# Patient Record
Sex: Male | Born: 1945 | Marital: Married | Smoking: Former smoker
Health system: Southern US, Community
[De-identification: ages and names within clinical notes are randomized; demographics above are authoritative.]

## PROBLEM LIST (undated history)

## (undated) DIAGNOSIS — M1990 Unspecified osteoarthritis, unspecified site: Secondary | ICD-10-CM

## (undated) DIAGNOSIS — T7840XA Allergy, unspecified, initial encounter: Secondary | ICD-10-CM

## (undated) DIAGNOSIS — S7291XA Unspecified fracture of right femur, initial encounter for closed fracture: Secondary | ICD-10-CM

## (undated) DIAGNOSIS — H3530 Unspecified macular degeneration: Secondary | ICD-10-CM

## (undated) DIAGNOSIS — M419 Scoliosis, unspecified: Secondary | ICD-10-CM

## (undated) DIAGNOSIS — H409 Unspecified glaucoma: Secondary | ICD-10-CM

## (undated) DIAGNOSIS — N183 Chronic kidney disease, stage 3 unspecified: Secondary | ICD-10-CM

## (undated) DIAGNOSIS — S82892A Other fracture of left lower leg, initial encounter for closed fracture: Secondary | ICD-10-CM

## (undated) DIAGNOSIS — I10 Essential (primary) hypertension: Secondary | ICD-10-CM

## (undated) DIAGNOSIS — H269 Unspecified cataract: Secondary | ICD-10-CM

## (undated) HISTORY — DX: Unspecified macular degeneration: H35.30

## (undated) HISTORY — PX: COLONOSCOPY: SHX174

## (undated) HISTORY — DX: Unspecified fracture of right femur, initial encounter for closed fracture: S72.91XA

## (undated) HISTORY — DX: Allergy, unspecified, initial encounter: T78.40XA

## (undated) HISTORY — DX: Unspecified cataract: H26.9

## (undated) HISTORY — DX: Chronic kidney disease, stage 3 unspecified: N18.30

## (undated) HISTORY — DX: Unspecified osteoarthritis, unspecified site: M19.90

## (undated) HISTORY — DX: Essential (primary) hypertension: I10

## (undated) HISTORY — PX: DENTAL SURGERY: SHX609

## (undated) HISTORY — DX: Other fracture of left lower leg, initial encounter for closed fracture: S82.892A

## (undated) HISTORY — DX: Chronic kidney disease, stage 3 (moderate): N18.3

## (undated) HISTORY — DX: Scoliosis, unspecified: M41.9

## (undated) HISTORY — DX: Unspecified glaucoma: H40.9

---
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
## 2009-10-04 DIAGNOSIS — R351 Nocturia: Secondary | ICD-10-CM | POA: Insufficient documentation

## 2009-10-04 DIAGNOSIS — M545 Low back pain, unspecified: Secondary | ICD-10-CM | POA: Insufficient documentation

## 2009-10-04 DIAGNOSIS — M419 Scoliosis, unspecified: Secondary | ICD-10-CM | POA: Insufficient documentation

## 2011-10-17 DIAGNOSIS — I10 Essential (primary) hypertension: Secondary | ICD-10-CM | POA: Diagnosis not present

## 2011-10-17 DIAGNOSIS — Z125 Encounter for screening for malignant neoplasm of prostate: Secondary | ICD-10-CM | POA: Diagnosis not present

## 2011-10-24 DIAGNOSIS — Z125 Encounter for screening for malignant neoplasm of prostate: Secondary | ICD-10-CM | POA: Diagnosis not present

## 2011-10-24 DIAGNOSIS — I10 Essential (primary) hypertension: Secondary | ICD-10-CM | POA: Diagnosis not present

## 2011-10-24 DIAGNOSIS — R351 Nocturia: Secondary | ICD-10-CM | POA: Diagnosis not present

## 2011-10-24 DIAGNOSIS — M545 Low back pain: Secondary | ICD-10-CM | POA: Diagnosis not present

## 2011-10-24 DIAGNOSIS — Z0000 Encounter for general adult medical examination without abnormal findings: Secondary | ICD-10-CM | POA: Diagnosis not present

## 2011-10-24 DIAGNOSIS — Z1212 Encounter for screening for malignant neoplasm of rectum: Secondary | ICD-10-CM | POA: Diagnosis not present

## 2012-07-06 DIAGNOSIS — Z23 Encounter for immunization: Secondary | ICD-10-CM | POA: Diagnosis not present

## 2012-07-12 DIAGNOSIS — H3530 Unspecified macular degeneration: Secondary | ICD-10-CM | POA: Diagnosis not present

## 2012-07-12 DIAGNOSIS — H43819 Vitreous degeneration, unspecified eye: Secondary | ICD-10-CM | POA: Diagnosis not present

## 2012-07-12 DIAGNOSIS — H2510 Age-related nuclear cataract, unspecified eye: Secondary | ICD-10-CM | POA: Diagnosis not present

## 2012-07-12 DIAGNOSIS — H52209 Unspecified astigmatism, unspecified eye: Secondary | ICD-10-CM | POA: Diagnosis not present

## 2012-07-13 DIAGNOSIS — H43819 Vitreous degeneration, unspecified eye: Secondary | ICD-10-CM | POA: Diagnosis not present

## 2012-08-19 DIAGNOSIS — H43819 Vitreous degeneration, unspecified eye: Secondary | ICD-10-CM | POA: Diagnosis not present

## 2012-10-21 DIAGNOSIS — I10 Essential (primary) hypertension: Secondary | ICD-10-CM | POA: Diagnosis not present

## 2012-10-21 DIAGNOSIS — Z125 Encounter for screening for malignant neoplasm of prostate: Secondary | ICD-10-CM | POA: Diagnosis not present

## 2012-10-28 ENCOUNTER — Encounter: Payer: Self-pay | Admitting: Internal Medicine

## 2012-10-28 DIAGNOSIS — N318 Other neuromuscular dysfunction of bladder: Secondary | ICD-10-CM | POA: Diagnosis not present

## 2012-10-28 DIAGNOSIS — I10 Essential (primary) hypertension: Secondary | ICD-10-CM | POA: Diagnosis not present

## 2012-10-28 DIAGNOSIS — Z125 Encounter for screening for malignant neoplasm of prostate: Secondary | ICD-10-CM | POA: Diagnosis not present

## 2012-10-28 DIAGNOSIS — Z0000 Encounter for general adult medical examination without abnormal findings: Secondary | ICD-10-CM | POA: Diagnosis not present

## 2012-10-28 DIAGNOSIS — Z1331 Encounter for screening for depression: Secondary | ICD-10-CM | POA: Diagnosis not present

## 2012-10-28 DIAGNOSIS — N182 Chronic kidney disease, stage 2 (mild): Secondary | ICD-10-CM | POA: Diagnosis not present

## 2012-11-01 DIAGNOSIS — H3530 Unspecified macular degeneration: Secondary | ICD-10-CM | POA: Diagnosis not present

## 2012-11-01 DIAGNOSIS — H2510 Age-related nuclear cataract, unspecified eye: Secondary | ICD-10-CM | POA: Diagnosis not present

## 2012-11-01 DIAGNOSIS — H43819 Vitreous degeneration, unspecified eye: Secondary | ICD-10-CM | POA: Diagnosis not present

## 2012-11-01 DIAGNOSIS — Z1212 Encounter for screening for malignant neoplasm of rectum: Secondary | ICD-10-CM | POA: Diagnosis not present

## 2012-11-01 DIAGNOSIS — H5210 Myopia, unspecified eye: Secondary | ICD-10-CM | POA: Diagnosis not present

## 2012-11-24 ENCOUNTER — Ambulatory Visit (AMBULATORY_SURGERY_CENTER): Payer: Medicare Other | Admitting: *Deleted

## 2012-11-24 VITALS — Ht 75.5 in | Wt 221.4 lb

## 2012-11-24 DIAGNOSIS — Z1211 Encounter for screening for malignant neoplasm of colon: Secondary | ICD-10-CM

## 2012-11-24 MED ORDER — MOVIPREP 100 G PO SOLR: 1.0000 | Freq: Once | ORAL | Status: DC

## 2012-11-24 NOTE — Progress Notes (Signed)
No egg or soy allergy. ewm  No problems with sedation in the past. ewm

## 2012-12-01 ENCOUNTER — Telehealth: Payer: Self-pay | Admitting: Internal Medicine

## 2012-12-01 NOTE — Telephone Encounter (Signed)
Did speak with patient. He states he's feeling okay to have exam, denies any fever. Instructed patient to use his on judgement, that it seems he is okay to have procedure unless symptoms worsen or he starts having fever. He understands.

## 2012-12-06 ENCOUNTER — Ambulatory Visit (AMBULATORY_SURGERY_CENTER): Payer: Medicare Other | Admitting: Internal Medicine

## 2012-12-06 ENCOUNTER — Encounter: Payer: Self-pay | Admitting: Internal Medicine

## 2012-12-06 VITALS — BP 112/79 | HR 70 | Temp 98.5°F | Resp 23 | Ht 75.5 in | Wt 221.0 lb

## 2012-12-06 DIAGNOSIS — K5730 Diverticulosis of large intestine without perforation or abscess without bleeding: Secondary | ICD-10-CM

## 2012-12-06 DIAGNOSIS — Z1211 Encounter for screening for malignant neoplasm of colon: Secondary | ICD-10-CM

## 2012-12-06 DIAGNOSIS — I10 Essential (primary) hypertension: Secondary | ICD-10-CM | POA: Diagnosis not present

## 2012-12-06 MED ORDER — SODIUM CHLORIDE 0.9 % IV SOLN: 500.0000 mL | INTRAVENOUS | Status: DC

## 2012-12-06 MED ORDER — FLEET ENEMA 7-19 GM/118ML RE ENEM: 1.0000 | ENEMA | Freq: Once | RECTAL | Status: DC

## 2012-12-06 NOTE — Progress Notes (Signed)
Stool changed after enema from cloudy light brown to clear yellow.

## 2012-12-06 NOTE — Progress Notes (Signed)
Patient did not experience any of the following events: a burn prior to discharge; a fall within the facility; wrong site/side/patient/procedure/implant event; or a hospital transfer or hospital admission upon discharge from the facility. (G8907) Patient did not have preoperative order for IV antibiotic SSI prophylaxis. (G8918)  

## 2012-12-06 NOTE — Op Note (Signed)
 [facility] [address] [place], [address]   COLONOSCOPY PROCEDURE REPORT  PATIENT: Ricky Singh, Ricky Singh  MR#: [id number] BIRTHDATE: November 22, 1945 , 66  yrs. old GENDER: Male ENDOSCOPIST: Roxy Cedar, MD REFERRED XB:JYNWGN Eloise Harman, M.D. PROCEDURE DATE:  12/06/2012 PROCEDURE:   Colonoscopy, screening ASA CLASS:   Class II INDICATIONS:Average risk patient for colon cancer.   Reports initial colonoscopy in [place] 10+ yrs ago (no polyps) MEDICATIONS: MAC sedation, administered by CRNA and propofol (Diprivan) 600mg  IV  DESCRIPTION OF PROCEDURE:   After the risks benefits and alternatives of the procedure were thoroughly explained, informed consent was obtained.  A digital rectal exam revealed no abnormalities of the rectum.   The LB CF-[id number]  endoscope was introduced through the anus and advanced to the cecum, which was identified by both the appendix and ileocecal valve. No adverse events experienced.   The quality of the prep was adequate, using MoviPrep  The instrument was then slowly withdrawn as the colon was fully examined.      COLON FINDINGS: Moderate diverticulosis was noted in the sigmoid colon.   The colon was otherwise normal.  There was no inflammation, polyps or cancers .  Retroflexed views revealed internal hemorrhoids. The time to cecum=4 minutes 23 seconds. Withdrawal time=14 minutes 10 seconds.  The scope was withdrawn and the procedure completed. COMPLICATIONS: There were no complications.  ENDOSCOPIC IMPRESSION: 1.   Moderate diverticulosis was noted in the sigmoid colon 2.   The colon was otherwise normal  RECOMMENDATIONS: 1. Continue current colorectal screening recommendations for "routine risk" patients with a repeat colonoscopy in 10 years.   eSigned:  Roxy Cedar, MD 12/06/2012 2:40 PM   cc: Jarome Matin, MD and The Patient

## 2012-12-06 NOTE — Patient Instructions (Addendum)
YOU HAD AN ENDOSCOPIC PROCEDURE TODAY AT THE [facility]: Refer to the procedure report that was given to you for any specific questions about what was found during the examination.  If the procedure report does not answer your questions, please call your gastroenterologist to clarify.  If you requested that your care partner not be given the details of your procedure findings, then the procedure report has been included in a sealed envelope for you to review at your convenience later.  YOU SHOULD EXPECT: Some feelings of bloating in the abdomen. Passage of more gas than usual.  Walking can help get rid of the air that was put into your GI tract during the procedure and reduce the bloating. If you had a lower endoscopy (such as a colonoscopy or flexible sigmoidoscopy) you may notice spotting of blood in your stool or on the toilet paper. If you underwent a bowel prep for your procedure, then you may not have a normal bowel movement for a few days.  DIET: Your first meal following the procedure should be a light meal and then it is ok to progress to your normal diet.  A half-sandwich or bowl of soup is an example of a good first meal.  Heavy or fried foods are harder to digest and may make you feel nauseous or bloated.  Likewise meals heavy in dairy and vegetables can cause extra gas to form and this can also increase the bloating.  Drink plenty of fluids but you should avoid alcoholic beverages for 24 hours.  ACTIVITY: Your care partner should take you home directly after the procedure.  You should plan to take it easy, moving slowly for the rest of the day.  You can resume normal activity the day after the procedure however you should NOT DRIVE or use heavy machinery for 24 hours (because of the sedation medicines used during the test).    SYMPTOMS TO REPORT IMMEDIATELY: A gastroenterologist can be reached at any hour.  During normal business hours, 8:30 AM to 5:00 PM Monday through Friday,  call [phone].  After hours and on weekends, please call the GI answering service at [phone] who will take a message and have the physician on call contact you.   Following lower endoscopy (colonoscopy or flexible sigmoidoscopy):  Excessive amounts of blood in the stool  Significant tenderness or worsening of abdominal pains  Swelling of the abdomen that is new, acute  Fever of 100F or higher   FOLLOW UP: If any biopsies were taken you will be contacted by phone or by letter within the next 1-3 weeks.  Call your gastroenterologist if you have not heard about the biopsies in 3 weeks.  Our staff will call the home number listed on your records the next business day following your procedure to check on you and address any questions or concerns that you may have at that time regarding the information given to you following your procedure. This is a courtesy call and so if there is no answer at the home number and we have not heard from you through the emergency physician on call, we will assume that you have returned to your regular daily activities without incident.  SIGNATURES/CONFIDENTIALITY: You and/or your care partner have signed paperwork which will be entered into your electronic medical record.  These signatures attest to the fact that that the information above on your After Visit Summary has been reviewed and is understood.  Full responsibility of the confidentiality of   this discharge information lies with you and/or your care-partner.   INFORMATION ON DIVERTICULOSIS GIVEN TO YOU TODAY 

## 2012-12-07 ENCOUNTER — Telehealth: Payer: Self-pay

## 2012-12-07 NOTE — Telephone Encounter (Signed)
Left message on answering machine. 

## 2012-12-15 ENCOUNTER — Encounter: Payer: Self-pay | Admitting: Internal Medicine

## 2013-06-16 DIAGNOSIS — Z23 Encounter for immunization: Secondary | ICD-10-CM | POA: Diagnosis not present

## 2013-11-18 DIAGNOSIS — H43819 Vitreous degeneration, unspecified eye: Secondary | ICD-10-CM | POA: Diagnosis not present

## 2013-11-18 DIAGNOSIS — H35379 Puckering of macula, unspecified eye: Secondary | ICD-10-CM | POA: Diagnosis not present

## 2013-11-18 DIAGNOSIS — H3530 Unspecified macular degeneration: Secondary | ICD-10-CM | POA: Diagnosis not present

## 2013-11-18 DIAGNOSIS — H2510 Age-related nuclear cataract, unspecified eye: Secondary | ICD-10-CM | POA: Diagnosis not present

## 2013-11-21 DIAGNOSIS — Z125 Encounter for screening for malignant neoplasm of prostate: Secondary | ICD-10-CM | POA: Diagnosis not present

## 2013-11-21 DIAGNOSIS — I10 Essential (primary) hypertension: Secondary | ICD-10-CM | POA: Diagnosis not present

## 2013-11-21 DIAGNOSIS — Z79899 Other long term (current) drug therapy: Secondary | ICD-10-CM | POA: Diagnosis not present

## 2013-11-23 DIAGNOSIS — R030 Elevated blood-pressure reading, without diagnosis of hypertension: Secondary | ICD-10-CM | POA: Diagnosis not present

## 2013-11-23 DIAGNOSIS — N182 Chronic kidney disease, stage 2 (mild): Secondary | ICD-10-CM | POA: Diagnosis not present

## 2013-11-23 DIAGNOSIS — M545 Low back pain, unspecified: Secondary | ICD-10-CM | POA: Diagnosis not present

## 2013-11-23 DIAGNOSIS — Z1331 Encounter for screening for depression: Secondary | ICD-10-CM | POA: Diagnosis not present

## 2013-11-23 DIAGNOSIS — R351 Nocturia: Secondary | ICD-10-CM | POA: Diagnosis not present

## 2013-11-23 DIAGNOSIS — I10 Essential (primary) hypertension: Secondary | ICD-10-CM | POA: Diagnosis not present

## 2013-11-23 DIAGNOSIS — Z0000 Encounter for general adult medical examination without abnormal findings: Secondary | ICD-10-CM | POA: Diagnosis not present

## 2013-11-23 DIAGNOSIS — N529 Male erectile dysfunction, unspecified: Secondary | ICD-10-CM | POA: Insufficient documentation

## 2013-11-25 DIAGNOSIS — Z1212 Encounter for screening for malignant neoplasm of rectum: Secondary | ICD-10-CM | POA: Diagnosis not present

## 2014-06-22 DIAGNOSIS — Z23 Encounter for immunization: Secondary | ICD-10-CM | POA: Diagnosis not present

## 2014-10-23 DIAGNOSIS — M5441 Lumbago with sciatica, right side: Secondary | ICD-10-CM | POA: Diagnosis not present

## 2014-10-27 DIAGNOSIS — M47816 Spondylosis without myelopathy or radiculopathy, lumbar region: Secondary | ICD-10-CM | POA: Diagnosis not present

## 2014-10-30 DIAGNOSIS — M4806 Spinal stenosis, lumbar region: Secondary | ICD-10-CM | POA: Diagnosis not present

## 2014-10-31 DIAGNOSIS — M545 Low back pain: Secondary | ICD-10-CM | POA: Diagnosis not present

## 2014-11-02 DIAGNOSIS — M545 Low back pain: Secondary | ICD-10-CM | POA: Diagnosis not present

## 2014-11-06 DIAGNOSIS — M545 Low back pain: Secondary | ICD-10-CM | POA: Diagnosis not present

## 2014-11-09 DIAGNOSIS — M545 Low back pain: Secondary | ICD-10-CM | POA: Diagnosis not present

## 2014-11-13 DIAGNOSIS — M545 Low back pain: Secondary | ICD-10-CM | POA: Diagnosis not present

## 2014-11-15 DIAGNOSIS — M545 Low back pain: Secondary | ICD-10-CM | POA: Diagnosis not present

## 2014-11-20 DIAGNOSIS — M545 Low back pain: Secondary | ICD-10-CM | POA: Diagnosis not present

## 2014-11-23 DIAGNOSIS — M545 Low back pain: Secondary | ICD-10-CM | POA: Diagnosis not present

## 2014-11-24 DIAGNOSIS — I10 Essential (primary) hypertension: Secondary | ICD-10-CM | POA: Diagnosis not present

## 2014-11-24 DIAGNOSIS — Z125 Encounter for screening for malignant neoplasm of prostate: Secondary | ICD-10-CM | POA: Diagnosis not present

## 2014-11-24 DIAGNOSIS — Z0000 Encounter for general adult medical examination without abnormal findings: Secondary | ICD-10-CM | POA: Diagnosis not present

## 2014-11-27 DIAGNOSIS — M545 Low back pain: Secondary | ICD-10-CM | POA: Diagnosis not present

## 2014-11-30 DIAGNOSIS — M545 Low back pain: Secondary | ICD-10-CM | POA: Diagnosis not present

## 2014-12-01 DIAGNOSIS — N529 Male erectile dysfunction, unspecified: Secondary | ICD-10-CM | POA: Diagnosis not present

## 2014-12-01 DIAGNOSIS — M419 Scoliosis, unspecified: Secondary | ICD-10-CM | POA: Diagnosis not present

## 2014-12-01 DIAGNOSIS — Z0000 Encounter for general adult medical examination without abnormal findings: Secondary | ICD-10-CM | POA: Diagnosis not present

## 2014-12-01 DIAGNOSIS — I10 Essential (primary) hypertension: Secondary | ICD-10-CM | POA: Diagnosis not present

## 2014-12-01 DIAGNOSIS — N182 Chronic kidney disease, stage 2 (mild): Secondary | ICD-10-CM | POA: Diagnosis not present

## 2014-12-01 DIAGNOSIS — M545 Low back pain: Secondary | ICD-10-CM | POA: Diagnosis not present

## 2014-12-01 DIAGNOSIS — Z1389 Encounter for screening for other disorder: Secondary | ICD-10-CM | POA: Diagnosis not present

## 2014-12-01 DIAGNOSIS — Z6826 Body mass index (BMI) 26.0-26.9, adult: Secondary | ICD-10-CM | POA: Diagnosis not present

## 2014-12-01 DIAGNOSIS — Z23 Encounter for immunization: Secondary | ICD-10-CM | POA: Diagnosis not present

## 2014-12-04 DIAGNOSIS — M5416 Radiculopathy, lumbar region: Secondary | ICD-10-CM | POA: Diagnosis not present

## 2014-12-05 DIAGNOSIS — Z1212 Encounter for screening for malignant neoplasm of rectum: Secondary | ICD-10-CM | POA: Diagnosis not present

## 2014-12-05 DIAGNOSIS — M545 Low back pain: Secondary | ICD-10-CM | POA: Diagnosis not present

## 2014-12-06 DIAGNOSIS — M545 Low back pain: Secondary | ICD-10-CM | POA: Diagnosis not present

## 2014-12-11 DIAGNOSIS — M545 Low back pain: Secondary | ICD-10-CM | POA: Diagnosis not present

## 2014-12-14 DIAGNOSIS — M545 Low back pain: Secondary | ICD-10-CM | POA: Diagnosis not present

## 2014-12-18 DIAGNOSIS — M545 Low back pain: Secondary | ICD-10-CM | POA: Diagnosis not present

## 2014-12-21 DIAGNOSIS — M5416 Radiculopathy, lumbar region: Secondary | ICD-10-CM | POA: Diagnosis not present

## 2014-12-25 DIAGNOSIS — M545 Low back pain: Secondary | ICD-10-CM | POA: Diagnosis not present

## 2014-12-28 DIAGNOSIS — M545 Low back pain: Secondary | ICD-10-CM | POA: Diagnosis not present

## 2015-01-03 DIAGNOSIS — M5416 Radiculopathy, lumbar region: Secondary | ICD-10-CM | POA: Diagnosis not present

## 2015-03-29 DIAGNOSIS — H2513 Age-related nuclear cataract, bilateral: Secondary | ICD-10-CM | POA: Diagnosis not present

## 2015-03-29 DIAGNOSIS — H40013 Open angle with borderline findings, low risk, bilateral: Secondary | ICD-10-CM | POA: Diagnosis not present

## 2015-03-29 DIAGNOSIS — H3531 Nonexudative age-related macular degeneration: Secondary | ICD-10-CM | POA: Diagnosis not present

## 2015-03-29 DIAGNOSIS — H04123 Dry eye syndrome of bilateral lacrimal glands: Secondary | ICD-10-CM | POA: Diagnosis not present

## 2015-03-30 DIAGNOSIS — H401212 Low-tension glaucoma, right eye, moderate stage: Secondary | ICD-10-CM | POA: Diagnosis not present

## 2015-05-02 DIAGNOSIS — H401212 Low-tension glaucoma, right eye, moderate stage: Secondary | ICD-10-CM | POA: Diagnosis not present

## 2015-06-08 DIAGNOSIS — M5416 Radiculopathy, lumbar region: Secondary | ICD-10-CM | POA: Diagnosis not present

## 2015-06-14 DIAGNOSIS — R2689 Other abnormalities of gait and mobility: Secondary | ICD-10-CM | POA: Diagnosis not present

## 2015-06-14 DIAGNOSIS — M5416 Radiculopathy, lumbar region: Secondary | ICD-10-CM | POA: Diagnosis not present

## 2015-06-14 DIAGNOSIS — R293 Abnormal posture: Secondary | ICD-10-CM | POA: Diagnosis not present

## 2015-06-16 DIAGNOSIS — Z23 Encounter for immunization: Secondary | ICD-10-CM | POA: Diagnosis not present

## 2015-06-18 DIAGNOSIS — R293 Abnormal posture: Secondary | ICD-10-CM | POA: Diagnosis not present

## 2015-06-18 DIAGNOSIS — M5416 Radiculopathy, lumbar region: Secondary | ICD-10-CM | POA: Diagnosis not present

## 2015-06-18 DIAGNOSIS — R2689 Other abnormalities of gait and mobility: Secondary | ICD-10-CM | POA: Diagnosis not present

## 2015-06-21 DIAGNOSIS — M5416 Radiculopathy, lumbar region: Secondary | ICD-10-CM | POA: Diagnosis not present

## 2015-06-21 DIAGNOSIS — R293 Abnormal posture: Secondary | ICD-10-CM | POA: Diagnosis not present

## 2015-06-21 DIAGNOSIS — R2689 Other abnormalities of gait and mobility: Secondary | ICD-10-CM | POA: Diagnosis not present

## 2015-06-22 DIAGNOSIS — M5416 Radiculopathy, lumbar region: Secondary | ICD-10-CM | POA: Diagnosis not present

## 2015-06-22 DIAGNOSIS — R293 Abnormal posture: Secondary | ICD-10-CM | POA: Diagnosis not present

## 2015-06-22 DIAGNOSIS — R2689 Other abnormalities of gait and mobility: Secondary | ICD-10-CM | POA: Diagnosis not present

## 2015-06-25 DIAGNOSIS — R293 Abnormal posture: Secondary | ICD-10-CM | POA: Diagnosis not present

## 2015-06-25 DIAGNOSIS — M5416 Radiculopathy, lumbar region: Secondary | ICD-10-CM | POA: Diagnosis not present

## 2015-06-25 DIAGNOSIS — R2689 Other abnormalities of gait and mobility: Secondary | ICD-10-CM | POA: Diagnosis not present

## 2015-06-27 DIAGNOSIS — R2689 Other abnormalities of gait and mobility: Secondary | ICD-10-CM | POA: Diagnosis not present

## 2015-06-27 DIAGNOSIS — M5416 Radiculopathy, lumbar region: Secondary | ICD-10-CM | POA: Diagnosis not present

## 2015-06-27 DIAGNOSIS — R293 Abnormal posture: Secondary | ICD-10-CM | POA: Diagnosis not present

## 2015-06-28 DIAGNOSIS — M5416 Radiculopathy, lumbar region: Secondary | ICD-10-CM | POA: Diagnosis not present

## 2015-07-10 DIAGNOSIS — R293 Abnormal posture: Secondary | ICD-10-CM | POA: Diagnosis not present

## 2015-07-10 DIAGNOSIS — M5416 Radiculopathy, lumbar region: Secondary | ICD-10-CM | POA: Diagnosis not present

## 2015-07-10 DIAGNOSIS — R2689 Other abnormalities of gait and mobility: Secondary | ICD-10-CM | POA: Diagnosis not present

## 2015-07-12 DIAGNOSIS — R293 Abnormal posture: Secondary | ICD-10-CM | POA: Diagnosis not present

## 2015-07-12 DIAGNOSIS — R2689 Other abnormalities of gait and mobility: Secondary | ICD-10-CM | POA: Diagnosis not present

## 2015-07-12 DIAGNOSIS — M5416 Radiculopathy, lumbar region: Secondary | ICD-10-CM | POA: Diagnosis not present

## 2015-07-13 DIAGNOSIS — R2689 Other abnormalities of gait and mobility: Secondary | ICD-10-CM | POA: Diagnosis not present

## 2015-07-13 DIAGNOSIS — M5416 Radiculopathy, lumbar region: Secondary | ICD-10-CM | POA: Diagnosis not present

## 2015-07-13 DIAGNOSIS — R293 Abnormal posture: Secondary | ICD-10-CM | POA: Diagnosis not present

## 2015-07-16 DIAGNOSIS — R293 Abnormal posture: Secondary | ICD-10-CM | POA: Diagnosis not present

## 2015-07-16 DIAGNOSIS — M5416 Radiculopathy, lumbar region: Secondary | ICD-10-CM | POA: Diagnosis not present

## 2015-07-16 DIAGNOSIS — R2689 Other abnormalities of gait and mobility: Secondary | ICD-10-CM | POA: Diagnosis not present

## 2015-07-18 DIAGNOSIS — R2689 Other abnormalities of gait and mobility: Secondary | ICD-10-CM | POA: Diagnosis not present

## 2015-07-18 DIAGNOSIS — M5416 Radiculopathy, lumbar region: Secondary | ICD-10-CM | POA: Diagnosis not present

## 2015-07-18 DIAGNOSIS — R293 Abnormal posture: Secondary | ICD-10-CM | POA: Diagnosis not present

## 2015-07-20 DIAGNOSIS — M5416 Radiculopathy, lumbar region: Secondary | ICD-10-CM | POA: Diagnosis not present

## 2015-07-20 DIAGNOSIS — R293 Abnormal posture: Secondary | ICD-10-CM | POA: Diagnosis not present

## 2015-07-20 DIAGNOSIS — R2689 Other abnormalities of gait and mobility: Secondary | ICD-10-CM | POA: Diagnosis not present

## 2015-07-23 DIAGNOSIS — M5416 Radiculopathy, lumbar region: Secondary | ICD-10-CM | POA: Diagnosis not present

## 2015-07-23 DIAGNOSIS — M533 Sacrococcygeal disorders, not elsewhere classified: Secondary | ICD-10-CM | POA: Diagnosis not present

## 2015-07-23 DIAGNOSIS — R293 Abnormal posture: Secondary | ICD-10-CM | POA: Diagnosis not present

## 2015-07-23 DIAGNOSIS — R2689 Other abnormalities of gait and mobility: Secondary | ICD-10-CM | POA: Diagnosis not present

## 2015-08-20 DIAGNOSIS — B079 Viral wart, unspecified: Secondary | ICD-10-CM | POA: Diagnosis not present

## 2015-08-20 DIAGNOSIS — D225 Melanocytic nevi of trunk: Secondary | ICD-10-CM | POA: Diagnosis not present

## 2015-08-28 DIAGNOSIS — M533 Sacrococcygeal disorders, not elsewhere classified: Secondary | ICD-10-CM | POA: Diagnosis not present

## 2015-10-08 DIAGNOSIS — M5416 Radiculopathy, lumbar region: Secondary | ICD-10-CM | POA: Diagnosis not present

## 2015-10-08 DIAGNOSIS — M533 Sacrococcygeal disorders, not elsewhere classified: Secondary | ICD-10-CM | POA: Diagnosis not present

## 2015-10-11 DIAGNOSIS — Z79899 Other long term (current) drug therapy: Secondary | ICD-10-CM | POA: Insufficient documentation

## 2015-11-09 DIAGNOSIS — H401212 Low-tension glaucoma, right eye, moderate stage: Secondary | ICD-10-CM | POA: Diagnosis not present

## 2015-12-03 DIAGNOSIS — N182 Chronic kidney disease, stage 2 (mild): Secondary | ICD-10-CM | POA: Diagnosis not present

## 2015-12-03 DIAGNOSIS — Z125 Encounter for screening for malignant neoplasm of prostate: Secondary | ICD-10-CM | POA: Diagnosis not present

## 2015-12-03 DIAGNOSIS — I10 Essential (primary) hypertension: Secondary | ICD-10-CM | POA: Diagnosis not present

## 2015-12-07 DIAGNOSIS — M419 Scoliosis, unspecified: Secondary | ICD-10-CM | POA: Diagnosis not present

## 2015-12-07 DIAGNOSIS — Z1389 Encounter for screening for other disorder: Secondary | ICD-10-CM | POA: Diagnosis not present

## 2015-12-07 DIAGNOSIS — N528 Other male erectile dysfunction: Secondary | ICD-10-CM | POA: Diagnosis not present

## 2015-12-07 DIAGNOSIS — Z0000 Encounter for general adult medical examination without abnormal findings: Secondary | ICD-10-CM | POA: Diagnosis not present

## 2015-12-07 DIAGNOSIS — M545 Low back pain: Secondary | ICD-10-CM | POA: Diagnosis not present

## 2015-12-07 DIAGNOSIS — R351 Nocturia: Secondary | ICD-10-CM | POA: Diagnosis not present

## 2015-12-07 DIAGNOSIS — Z6828 Body mass index (BMI) 28.0-28.9, adult: Secondary | ICD-10-CM | POA: Diagnosis not present

## 2015-12-07 DIAGNOSIS — N182 Chronic kidney disease, stage 2 (mild): Secondary | ICD-10-CM | POA: Diagnosis not present

## 2015-12-07 DIAGNOSIS — I10 Essential (primary) hypertension: Secondary | ICD-10-CM | POA: Diagnosis not present

## 2015-12-11 DIAGNOSIS — Z1212 Encounter for screening for malignant neoplasm of rectum: Secondary | ICD-10-CM | POA: Diagnosis not present

## 2016-04-14 DIAGNOSIS — H524 Presbyopia: Secondary | ICD-10-CM | POA: Diagnosis not present

## 2016-04-14 DIAGNOSIS — H353132 Nonexudative age-related macular degeneration, bilateral, intermediate dry stage: Secondary | ICD-10-CM | POA: Diagnosis not present

## 2016-04-14 DIAGNOSIS — H2513 Age-related nuclear cataract, bilateral: Secondary | ICD-10-CM | POA: Diagnosis not present

## 2016-04-14 DIAGNOSIS — H401212 Low-tension glaucoma, right eye, moderate stage: Secondary | ICD-10-CM | POA: Diagnosis not present

## 2016-05-22 DIAGNOSIS — M5416 Radiculopathy, lumbar region: Secondary | ICD-10-CM | POA: Diagnosis not present

## 2016-06-09 DIAGNOSIS — Z23 Encounter for immunization: Secondary | ICD-10-CM | POA: Diagnosis not present

## 2016-06-17 DIAGNOSIS — M533 Sacrococcygeal disorders, not elsewhere classified: Secondary | ICD-10-CM | POA: Diagnosis not present

## 2016-10-13 DIAGNOSIS — H401212 Low-tension glaucoma, right eye, moderate stage: Secondary | ICD-10-CM | POA: Diagnosis not present

## 2016-12-05 DIAGNOSIS — I10 Essential (primary) hypertension: Secondary | ICD-10-CM | POA: Diagnosis not present

## 2016-12-05 DIAGNOSIS — Z125 Encounter for screening for malignant neoplasm of prostate: Secondary | ICD-10-CM | POA: Diagnosis not present

## 2016-12-12 DIAGNOSIS — N183 Chronic kidney disease, stage 3 (moderate): Secondary | ICD-10-CM | POA: Diagnosis not present

## 2016-12-12 DIAGNOSIS — Z1212 Encounter for screening for malignant neoplasm of rectum: Secondary | ICD-10-CM | POA: Diagnosis not present

## 2016-12-12 DIAGNOSIS — M419 Scoliosis, unspecified: Secondary | ICD-10-CM | POA: Diagnosis not present

## 2016-12-12 DIAGNOSIS — Z6828 Body mass index (BMI) 28.0-28.9, adult: Secondary | ICD-10-CM | POA: Diagnosis not present

## 2016-12-12 DIAGNOSIS — Z0000 Encounter for general adult medical examination without abnormal findings: Secondary | ICD-10-CM | POA: Diagnosis not present

## 2016-12-12 DIAGNOSIS — R195 Other fecal abnormalities: Secondary | ICD-10-CM | POA: Diagnosis not present

## 2016-12-12 DIAGNOSIS — M545 Low back pain: Secondary | ICD-10-CM | POA: Diagnosis not present

## 2016-12-12 DIAGNOSIS — I10 Essential (primary) hypertension: Secondary | ICD-10-CM | POA: Diagnosis not present

## 2016-12-12 DIAGNOSIS — Z1389 Encounter for screening for other disorder: Secondary | ICD-10-CM | POA: Diagnosis not present

## 2017-01-20 ENCOUNTER — Encounter: Payer: Self-pay | Admitting: Internal Medicine

## 2017-01-20 ENCOUNTER — Ambulatory Visit (INDEPENDENT_AMBULATORY_CARE_PROVIDER_SITE_OTHER): Payer: Medicare Other | Admitting: Internal Medicine

## 2017-01-20 VITALS — BP 118/68 | HR 66 | Ht 74.5 in | Wt 227.0 lb

## 2017-01-20 DIAGNOSIS — R195 Other fecal abnormalities: Secondary | ICD-10-CM

## 2017-01-20 DIAGNOSIS — K5730 Diverticulosis of large intestine without perforation or abscess without bleeding: Secondary | ICD-10-CM | POA: Diagnosis not present

## 2017-01-20 DIAGNOSIS — K625 Hemorrhage of anus and rectum: Secondary | ICD-10-CM

## 2017-01-20 DIAGNOSIS — K648 Other hemorrhoids: Secondary | ICD-10-CM | POA: Diagnosis not present

## 2017-01-20 NOTE — Progress Notes (Signed)
HISTORY OF PRESENT ILLNESS:  Ricky Singh is a 71 y.o. male with past no history as listed below who is referred today by his primary care provider Dr. Philip Aspen regarding Hemoccult-positive stool. The patient was seen on one occasion in March 2014 regarding routine screening colonoscopy. Negative colonoscopy 10 years previous had been reported to be performed in [place]. His examination revealed moderate sigmoid diverticulosis but was otherwise normal. He did have internal hemorrhoids. As part of the patient's routine annual evaluation he underwent hemosure testing which returned positive. This was in March. At that same time CBC and comprehensive metabolic panel were unremarkable with hemoglobin of 13.7. Normal MCV. He is now referred. The patient denies melena. He has noticed occasional bright red blood per rectum, generally on the tissue, with hard bowel movements. No weight loss or abdominal pain. No interval change in his family history. Does take NSAIDs and aspirin. GI review of systems is otherwise negative.  REVIEW OF SYSTEMS:  All non-GI ROS unless otherwise stated in history of present illness negative except for arthritis and back pain  Past Medical History:  Diagnosis Date  . Ankle fracture, left   . Arthritis   . Chronic kidney disease (CKD), stage III (moderate)   . Femur fracture, right ([place])   . Hypertension   . Macular degeneration   . Scoliosis     Past Surgical History:  Procedure Laterality Date  . COLONOSCOPY    . DENTAL SURGERY    . [place]  . TONSILLECTOMY  1951   or [place]  reports that he has quit smoking. He has never used smokeless tobacco. He reports that he drinks about 8.4 oz of alcohol per week . He reports that he does not use drugs.  family history includes Angina in his maternal grandfather; Hypertension in his mother; Kidney cancer in his cousin; Lymphoma (age of onset: 7) in his father.  No Known  Allergies     PHYSICAL EXAMINATION: Vital signs: BP 118/68   Pulse 66   Ht 6' 2.5" (1.892 m)   Wt 227 lb (103 kg)   BMI 28.76 kg/m   Constitutional: generally well-appearing, no acute distress Psychiatric: alert and oriented x3, cooperative Eyes: extraocular movements intact, anicteric, conjunctiva pink Mouth: oral pharynx moist, no lesions Neck: suppleWithout thyromegaly Lymph: no lymphadenopathy Cardiovascular: heart regular rate and rhythm, no murmur Lungs: clear to auscultation bilaterally Abdomen: soft, nontender, nondistended, no obvious ascites, no peritoneal signs, normal bowel sounds, no organomegaly Rectal:Deferred until colonoscopy Extremities: no clubbing cyanosis or lower extremity edema bilaterally Skin: no lesions on visible extremities Neuro: No focal deficits. Cranial nerves intact  ASSESSMENT:  1. Hemoccult-positive stool. Normal hemoglobin. 2. Minor rectal bleeding. Likely hemorrhoids 3. Colonoscopy 2014 with diverticulosis and internal hemorrhoids   PLAN:  1. Colonoscopy to workup Hemoccult-positive stool.The nature of the procedure, as well as the risks, benefits, and alternatives were carefully and thoroughly reviewed with the patient. Ample time for discussion and questions allowed. The patient understood, was satisfied, and agreed to proceed. 2. Would not recommend routine Hemoccult testing in asymptomatic patient with no clinical indication for the purposes of screening. Reviewed this with him  25 minutes was spent face-to-face with the patient. Greater than 50% a counseling regarding the meaning of an occult-positive stool, his workup, and plans for colonoscopy  A copy of this consultation note has been sent to Dr. Philip Aspen

## 2017-01-20 NOTE — Patient Instructions (Signed)
We will contact you as soon as possible to schedule your procedure.

## 2017-02-09 ENCOUNTER — Encounter: Payer: Self-pay | Admitting: Internal Medicine

## 2017-03-23 ENCOUNTER — Ambulatory Visit (AMBULATORY_SURGERY_CENTER): Payer: Self-pay

## 2017-03-23 VITALS — Ht 75.0 in | Wt 221.0 lb

## 2017-03-23 DIAGNOSIS — R195 Other fecal abnormalities: Secondary | ICD-10-CM

## 2017-03-23 MED ORDER — NA SULFATE-K SULFATE-MG SULF 17.5-3.13-1.6 GM/177ML PO SOLN: 1.0000 | Freq: Once | ORAL | 0 refills | Status: AC

## 2017-03-23 NOTE — Progress Notes (Signed)
Denies allergies to eggs or soy products. Denies complication of anesthesia or sedation. Denies use of weight loss medication. Denies use of O2.   Emmi instructions given to patient.  

## 2017-04-06 ENCOUNTER — Encounter: Payer: Self-pay | Admitting: Internal Medicine

## 2017-04-06 ENCOUNTER — Ambulatory Visit (AMBULATORY_SURGERY_CENTER): Payer: Medicare Other | Admitting: Internal Medicine

## 2017-04-06 VITALS — BP 123/76 | HR 62 | Temp 98.6°F | Resp 14 | Ht 75.0 in | Wt 221.0 lb

## 2017-04-06 DIAGNOSIS — R195 Other fecal abnormalities: Secondary | ICD-10-CM

## 2017-04-06 DIAGNOSIS — Z1211 Encounter for screening for malignant neoplasm of colon: Secondary | ICD-10-CM | POA: Diagnosis not present

## 2017-04-06 DIAGNOSIS — K649 Unspecified hemorrhoids: Secondary | ICD-10-CM | POA: Diagnosis not present

## 2017-04-06 MED ORDER — SODIUM CHLORIDE 0.9 % IV SOLN: 500.0000 mL | INTRAVENOUS | Status: DC

## 2017-04-06 NOTE — Patient Instructions (Signed)
YOU HAD AN ENDOSCOPIC PROCEDURE TODAY AT [facility]:   Refer to the procedure report that was given to you for any specific questions about what was found during the examination.  If the procedure report does not answer your questions, please call your gastroenterologist to clarify.  If you requested that your care partner not be given the details of your procedure findings, then the procedure report has been included in a sealed envelope for you to review at your convenience later.  YOU SHOULD EXPECT: Some feelings of bloating in the abdomen. Passage of more gas than usual.  Walking can help get rid of the air that was put into your GI tract during the procedure and reduce the bloating. If you had a lower endoscopy (such as a colonoscopy or flexible sigmoidoscopy) you may notice spotting of blood in your stool or on the toilet paper. If you underwent a bowel prep for your procedure, you may not have a normal bowel movement for a few days.  Please Note:  You might notice some irritation and congestion in your nose or some drainage.  This is from the oxygen used during your procedure.  There is no need for concern and it should clear up in a day or so.  SYMPTOMS TO REPORT IMMEDIATELY:   Following lower endoscopy (colonoscopy or flexible sigmoidoscopy):  Excessive amounts of blood in the stool  Significant tenderness or worsening of abdominal pains  Swelling of the abdomen that is new, acute  Fever of 100F or higher  For urgent or emergent issues, a gastroenterologist can be reached at any hour by calling [phone].   DIET:  We do recommend a small meal at first, but then you may proceed to your regular diet.  Drink plenty of fluids but you should avoid alcoholic beverages for 24 hours.  ACTIVITY:  You should plan to take it easy for the rest of today and you should NOT DRIVE or use heavy machinery until tomorrow (because of the sedation medicines used during the test).     FOLLOW UP: Our staff will call the number listed on your records the next business day following your procedure to check on you and address any questions or concerns that you may have regarding the information given to you following your procedure. If we do not reach you, we will leave a message.  However, if you are feeling well and you are not experiencing any problems, there is no need to return our call.  We will assume that you have returned to your regular daily activities without incident.  SIGNATURES/CONFIDENTIALITY: You and/or your care partner have signed paperwork which will be entered into your electronic medical record.  These signatures attest to the fact that that the information above on your After Visit Summary has been reviewed and is understood.  Full responsibility of the confidentiality of this discharge information lies with you and/or your care-partner.  Please read over handouts about diverticulosis, hemorrhoids and high fiber diets  Continue your normal medications

## 2017-04-06 NOTE — Op Note (Signed)
[facility] Patient Name: Ricky Singh Procedure Date: 04/06/2017 2:17 PM MRN: [id number] Endoscopist: Docia Chuck. Henrene Pastor , MD Age: 71 Referring MD:  Date of Birth: Nov 14, 1945 Gender: Male Account #: [id number] Procedure:                Colonoscopy Indications:              Heme positive stool. Patient has had 2 negative                            screening colonoscopies previously. Most recent                            examination 2014 Medicines:                Monitored Anesthesia Care Procedure:                Pre-Anesthesia Assessment:                           - Prior to the procedure, a History and Physical                            was performed, and patient medications and                            allergies were reviewed. The patient's tolerance of                            previous anesthesia was also reviewed. The risks                            and benefits of the procedure and the sedation                            options and risks were discussed with the patient.                            All questions were answered, and informed consent                            was obtained. Prior Anticoagulants: The patient has                            taken no previous anticoagulant or antiplatelet                            agents. ASA Grade Assessment: II - A patient with                            mild systemic disease. After reviewing the risks                            and benefits, the patient was deemed in  satisfactory condition to undergo the procedure.                           After obtaining informed consent, the colonoscope                            was passed under direct vision. Throughout the                            procedure, the patient's blood pressure, pulse, and                            oxygen saturations were monitored continuously. The                            Model PCF-[id number]) scope was introduced                           through the anus and advanced to the the cecum,                            identified by appendiceal orifice and ileocecal                            valve. The ileocecal valve, appendiceal orifice,                            and rectum were photographed. The quality of the                            bowel preparation was excellent. The colonoscopy                            was performed without difficulty. The patient                            tolerated the procedure well. The bowel preparation                            used was SUPREP. Scope In: 2:21:04 PM Scope Out: 2:42:59 PM Scope Withdrawal Time: 0 hours 14 minutes 56 seconds  Total Procedure Duration: 0 hours 21 minutes 55 seconds  Findings:                 Multiple small and large-mouthed diverticula were                            found in the sigmoid colon.                           Internal hemorrhoids were found during                            retroflexion. The hemorrhoids were moderate.  The exam was otherwise without abnormality on                            direct and retroflexion views. Complications:            No immediate complications. Estimated blood loss:                            None. Estimated Blood Loss:     Estimated blood loss: none. Impression:               - Diverticulosis in the sigmoid colon.                           - Internal hemorrhoids.                           - The examination was otherwise normal on direct                            and retroflexion views.                           - No specimens collected. Recommendation:           - Repeat colonoscopy is not recommended for                            surveillance based on favorable findings and your                            current age.                           - Patient has a contact number available for                            emergencies. The signs and symptoms of potential                             delayed complications were discussed with the                            patient. Return to normal activities tomorrow.                            Written discharge instructions were provided to the                            patient.                           - Resume previous diet.                           - Continue present medications. Docia Chuck. Henrene Pastor, MD 04/06/2017 2:48:53 PM This report has been signed electronically.

## 2017-04-06 NOTE — Progress Notes (Signed)
To PACU VSS Report to RN 

## 2017-04-06 NOTE — Progress Notes (Signed)
Pt's states no medical or surgical changes since previsit or office visit. 

## 2017-04-07 ENCOUNTER — Telehealth: Payer: Self-pay | Admitting: *Deleted

## 2017-04-07 NOTE — Telephone Encounter (Signed)
  Follow up Call-  Call back number 04/06/2017  Post procedure Call Back phone  # [phone]  Permission to leave phone message Yes  Some recent data might be hidden     Patient questions:  Do you have a fever, pain , or abdominal swelling? No. Pain Score  0 *  Have you tolerated food without any problems? Yes.    Have you been able to return to your normal activities? Yes.    Do you have any questions about your discharge instructions: Diet   No. Medications  No. Follow up visit  No.  Do you have questions or concerns about your Care? No.  Actions: * If pain score is 4 or above: No action needed, pain <4.

## 2017-04-14 DIAGNOSIS — M5416 Radiculopathy, lumbar region: Secondary | ICD-10-CM | POA: Diagnosis not present

## 2017-04-20 DIAGNOSIS — H2513 Age-related nuclear cataract, bilateral: Secondary | ICD-10-CM | POA: Diagnosis not present

## 2017-04-20 DIAGNOSIS — H43813 Vitreous degeneration, bilateral: Secondary | ICD-10-CM | POA: Diagnosis not present

## 2017-04-20 DIAGNOSIS — H401212 Low-tension glaucoma, right eye, moderate stage: Secondary | ICD-10-CM | POA: Diagnosis not present

## 2017-04-20 DIAGNOSIS — H524 Presbyopia: Secondary | ICD-10-CM | POA: Diagnosis not present

## 2017-06-13 DIAGNOSIS — Z23 Encounter for immunization: Secondary | ICD-10-CM | POA: Diagnosis not present

## 2017-06-15 DIAGNOSIS — H401212 Low-tension glaucoma, right eye, moderate stage: Secondary | ICD-10-CM | POA: Diagnosis not present

## 2017-11-09 DIAGNOSIS — H401212 Low-tension glaucoma, right eye, moderate stage: Secondary | ICD-10-CM | POA: Diagnosis not present

## 2018-01-25 DIAGNOSIS — I10 Essential (primary) hypertension: Secondary | ICD-10-CM | POA: Diagnosis not present

## 2018-01-25 DIAGNOSIS — Z125 Encounter for screening for malignant neoplasm of prostate: Secondary | ICD-10-CM | POA: Diagnosis not present

## 2018-01-25 DIAGNOSIS — R82998 Other abnormal findings in urine: Secondary | ICD-10-CM | POA: Diagnosis not present

## 2018-02-01 DIAGNOSIS — R351 Nocturia: Secondary | ICD-10-CM | POA: Diagnosis not present

## 2018-02-01 DIAGNOSIS — N183 Chronic kidney disease, stage 3 (moderate): Secondary | ICD-10-CM | POA: Diagnosis not present

## 2018-02-01 DIAGNOSIS — Z0000 Encounter for general adult medical examination without abnormal findings: Secondary | ICD-10-CM | POA: Diagnosis not present

## 2018-02-01 DIAGNOSIS — Z6827 Body mass index (BMI) 27.0-27.9, adult: Secondary | ICD-10-CM | POA: Diagnosis not present

## 2018-02-01 DIAGNOSIS — Z1389 Encounter for screening for other disorder: Secondary | ICD-10-CM | POA: Diagnosis not present

## 2018-02-01 DIAGNOSIS — N528 Other male erectile dysfunction: Secondary | ICD-10-CM | POA: Diagnosis not present

## 2018-02-01 DIAGNOSIS — M545 Low back pain: Secondary | ICD-10-CM | POA: Diagnosis not present

## 2018-02-01 DIAGNOSIS — I10 Essential (primary) hypertension: Secondary | ICD-10-CM | POA: Diagnosis not present

## 2018-04-26 DIAGNOSIS — H353132 Nonexudative age-related macular degeneration, bilateral, intermediate dry stage: Secondary | ICD-10-CM | POA: Diagnosis not present

## 2018-04-26 DIAGNOSIS — H2513 Age-related nuclear cataract, bilateral: Secondary | ICD-10-CM | POA: Diagnosis not present

## 2018-04-26 DIAGNOSIS — H401212 Low-tension glaucoma, right eye, moderate stage: Secondary | ICD-10-CM | POA: Diagnosis not present

## 2018-04-26 DIAGNOSIS — H524 Presbyopia: Secondary | ICD-10-CM | POA: Diagnosis not present

## 2018-05-01 DIAGNOSIS — L509 Urticaria, unspecified: Secondary | ICD-10-CM | POA: Diagnosis not present

## 2018-06-12 DIAGNOSIS — Z23 Encounter for immunization: Secondary | ICD-10-CM | POA: Diagnosis not present

## 2018-08-17 ENCOUNTER — Encounter (HOSPITAL_COMMUNITY): Payer: Self-pay | Admitting: Emergency Medicine

## 2018-08-17 ENCOUNTER — Inpatient Hospital Stay (HOSPITAL_COMMUNITY)
Admission: EM | Admit: 2018-08-17 | Discharge: 2018-08-22 | DRG: 871 | Disposition: A | Discharge status: Home / Self Care | Payer: Medicare Other | Source: Ambulatory Visit | Attending: Internal Medicine | Admitting: Internal Medicine

## 2018-08-17 ENCOUNTER — Emergency Department (HOSPITAL_COMMUNITY): Payer: Medicare Other

## 2018-08-17 DIAGNOSIS — M419 Scoliosis, unspecified: Secondary | ICD-10-CM | POA: Diagnosis present

## 2018-08-17 DIAGNOSIS — A388 Scarlet fever with other complications: Secondary | ICD-10-CM | POA: Diagnosis present

## 2018-08-17 DIAGNOSIS — G8929 Other chronic pain: Secondary | ICD-10-CM | POA: Diagnosis present

## 2018-08-17 DIAGNOSIS — R0989 Other specified symptoms and signs involving the circulatory and respiratory systems: Secondary | ICD-10-CM | POA: Diagnosis not present

## 2018-08-17 DIAGNOSIS — F1010 Alcohol abuse, uncomplicated: Secondary | ICD-10-CM | POA: Diagnosis present

## 2018-08-17 DIAGNOSIS — M545 Low back pain: Secondary | ICD-10-CM | POA: Diagnosis present

## 2018-08-17 DIAGNOSIS — Z87891 Personal history of nicotine dependence: Secondary | ICD-10-CM

## 2018-08-17 DIAGNOSIS — R402360 Coma scale, best motor response, obeys commands, unspecified time: Secondary | ICD-10-CM | POA: Diagnosis present

## 2018-08-17 DIAGNOSIS — A389 Scarlet fever, uncomplicated: Secondary | ICD-10-CM | POA: Diagnosis not present

## 2018-08-17 DIAGNOSIS — A400 Sepsis due to streptococcus, group A: Secondary | ICD-10-CM | POA: Diagnosis not present

## 2018-08-17 DIAGNOSIS — R402140 Coma scale, eyes open, spontaneous, unspecified time: Secondary | ICD-10-CM | POA: Diagnosis present

## 2018-08-17 DIAGNOSIS — J020 Streptococcal pharyngitis: Secondary | ICD-10-CM | POA: Diagnosis not present

## 2018-08-17 DIAGNOSIS — J189 Pneumonia, unspecified organism: Secondary | ICD-10-CM | POA: Diagnosis not present

## 2018-08-17 DIAGNOSIS — Z7982 Long term (current) use of aspirin: Secondary | ICD-10-CM

## 2018-08-17 DIAGNOSIS — J449 Chronic obstructive pulmonary disease, unspecified: Secondary | ICD-10-CM | POA: Diagnosis present

## 2018-08-17 DIAGNOSIS — I129 Hypertensive chronic kidney disease with stage 1 through stage 4 chronic kidney disease, or unspecified chronic kidney disease: Secondary | ICD-10-CM | POA: Diagnosis present

## 2018-08-17 DIAGNOSIS — N183 Chronic kidney disease, stage 3 unspecified: Secondary | ICD-10-CM | POA: Diagnosis present

## 2018-08-17 DIAGNOSIS — I4819 Other persistent atrial fibrillation: Secondary | ICD-10-CM | POA: Diagnosis present

## 2018-08-17 DIAGNOSIS — A483 Toxic shock syndrome: Secondary | ICD-10-CM | POA: Diagnosis present

## 2018-08-17 DIAGNOSIS — A419 Sepsis, unspecified organism: Secondary | ICD-10-CM | POA: Diagnosis present

## 2018-08-17 DIAGNOSIS — Z791 Long term (current) use of non-steroidal anti-inflammatories (NSAID): Secondary | ICD-10-CM

## 2018-08-17 DIAGNOSIS — Z79899 Other long term (current) drug therapy: Secondary | ICD-10-CM

## 2018-08-17 DIAGNOSIS — D631 Anemia in chronic kidney disease: Secondary | ICD-10-CM | POA: Diagnosis present

## 2018-08-17 DIAGNOSIS — I4891 Unspecified atrial fibrillation: Secondary | ICD-10-CM | POA: Diagnosis not present

## 2018-08-17 DIAGNOSIS — D696 Thrombocytopenia, unspecified: Secondary | ICD-10-CM | POA: Diagnosis present

## 2018-08-17 DIAGNOSIS — H409 Unspecified glaucoma: Secondary | ICD-10-CM | POA: Diagnosis present

## 2018-08-17 DIAGNOSIS — R590 Localized enlarged lymph nodes: Secondary | ICD-10-CM | POA: Diagnosis present

## 2018-08-17 DIAGNOSIS — J90 Pleural effusion, not elsewhere classified: Secondary | ICD-10-CM | POA: Diagnosis present

## 2018-08-17 DIAGNOSIS — R918 Other nonspecific abnormal finding of lung field: Secondary | ICD-10-CM | POA: Diagnosis present

## 2018-08-17 DIAGNOSIS — R509 Fever, unspecified: Secondary | ICD-10-CM | POA: Diagnosis present

## 2018-08-17 DIAGNOSIS — Z6829 Body mass index (BMI) 29.0-29.9, adult: Secondary | ICD-10-CM | POA: Diagnosis not present

## 2018-08-17 DIAGNOSIS — J209 Acute bronchitis, unspecified: Secondary | ICD-10-CM | POA: Diagnosis present

## 2018-08-17 DIAGNOSIS — H6692 Otitis media, unspecified, left ear: Secondary | ICD-10-CM | POA: Diagnosis not present

## 2018-08-17 DIAGNOSIS — R402250 Coma scale, best verbal response, oriented, unspecified time: Secondary | ICD-10-CM | POA: Diagnosis present

## 2018-08-17 LAB — HEPATIC FUNCTION PANEL
ALK PHOS: 45 U/L (ref 38–126)
ALT: 20 U/L (ref 0–44)
AST: 20 U/L (ref 15–41)
Albumin: 2.7 g/dL — ABNORMAL LOW (ref 3.5–5.0)
BILIRUBIN TOTAL: 0.5 mg/dL (ref 0.3–1.2)
Bilirubin, Direct: 0.2 mg/dL (ref 0.0–0.2)
Indirect Bilirubin: 0.3 mg/dL (ref 0.3–0.9)
Total Protein: 5.8 g/dL — ABNORMAL LOW (ref 6.5–8.1)

## 2018-08-17 LAB — CBC WITH DIFFERENTIAL/PLATELET
ABS IMMATURE GRANULOCYTES: 0.01 10*3/uL (ref 0.00–0.07)
BASOS ABS: 0 10*3/uL (ref 0.0–0.1)
Basophils Relative: 0 %
Eosinophils Absolute: 0.1 10*3/uL (ref 0.0–0.5)
Eosinophils Relative: 1 %
HEMATOCRIT: 42 % (ref 39.0–52.0)
HEMOGLOBIN: 13.5 g/dL (ref 13.0–17.0)
Immature Granulocytes: 0 %
LYMPHS ABS: 0.3 10*3/uL — AB (ref 0.7–4.0)
LYMPHS PCT: 7 %
MCH: 29.9 pg (ref 26.0–34.0)
MCHC: 32.1 g/dL (ref 30.0–36.0)
MCV: 93.1 fL (ref 80.0–100.0)
Monocytes Absolute: 0.4 10*3/uL (ref 0.1–1.0)
Monocytes Relative: 9 %
NEUTROS ABS: 3.9 10*3/uL (ref 1.7–7.7)
NRBC: 0 % (ref 0.0–0.2)
Neutrophils Relative %: 83 %
Platelets: 121 10*3/uL — ABNORMAL LOW (ref 150–400)
RBC: 4.51 MIL/uL (ref 4.22–5.81)
RDW: 12.6 % (ref 11.5–15.5)
WBC: 4.7 10*3/uL (ref 4.0–10.5)

## 2018-08-17 LAB — BASIC METABOLIC PANEL
Anion gap: 8 (ref 5–15)
BUN: 21 mg/dL (ref 8–23)
CO2: 18 mmol/L — ABNORMAL LOW (ref 22–32)
Calcium: 8.4 mg/dL — ABNORMAL LOW (ref 8.9–10.3)
Chloride: 110 mmol/L (ref 98–111)
Creatinine, Ser: 1.59 mg/dL — ABNORMAL HIGH (ref 0.61–1.24)
GFR calc Af Amer: 48 mL/min — ABNORMAL LOW (ref >60)
GFR calc non Af Amer: 41 mL/min — ABNORMAL LOW (ref >60)
Glucose, Bld: 123 mg/dL — ABNORMAL HIGH (ref 70–99)
Potassium: 4.2 mmol/L (ref 3.5–5.1)
Sodium: 136 mmol/L (ref 135–145)

## 2018-08-17 LAB — URINALYSIS, ROUTINE W REFLEX MICROSCOPIC
BILIRUBIN URINE: NEGATIVE
Glucose, UA: NEGATIVE mg/dL
Hgb urine dipstick: NEGATIVE
KETONES UR: NEGATIVE mg/dL
LEUKOCYTES UA: NEGATIVE
NITRITE: NEGATIVE
PROTEIN: NEGATIVE mg/dL
Specific Gravity, Urine: 1.015 (ref 1.005–1.030)
pH: 5 (ref 5.0–8.0)

## 2018-08-17 LAB — INFLUENZA PANEL BY PCR (TYPE A & B)
Influenza A By PCR: NEGATIVE
Influenza B By PCR: NEGATIVE

## 2018-08-17 LAB — I-STAT CG4 LACTIC ACID, ED: LACTIC ACID, VENOUS: 0.66 mmol/L (ref 0.5–1.9)

## 2018-08-17 LAB — I-STAT TROPONIN, ED: Troponin i, poc: 0.03 ng/mL (ref 0.00–0.08)

## 2018-08-17 LAB — TSH: TSH: 1.412 u[IU]/mL (ref 0.350–4.500)

## 2018-08-17 MED ORDER — SODIUM CHLORIDE 0.9 % IV SOLN
INTRAVENOUS | Status: AC
  Administered 2018-08-17 – 2018-08-18 (×2): via INTRAVENOUS

## 2018-08-17 MED ORDER — PROSIGHT PO TABS
1.0000 | ORAL_TABLET | Freq: Every day | ORAL | Status: DC
  Administered 2018-08-17 – 2018-08-22 (×6): 1 via ORAL
  Filled 2018-08-17 (×6): qty 1

## 2018-08-17 MED ORDER — SODIUM CHLORIDE 0.9 % IV BOLUS
1000.0000 mL | Freq: Once | INTRAVENOUS | Status: AC
  Administered 2018-08-17: 1000 mL via INTRAVENOUS

## 2018-08-17 MED ORDER — BRIMONIDINE TARTRATE 0.15 % OP SOLN
1.0000 [drp] | Freq: Two times a day (BID) | OPHTHALMIC | Status: DC
  Administered 2018-08-18 – 2018-08-22 (×9): 1 [drp] via OPHTHALMIC
  Filled 2018-08-17: qty 5

## 2018-08-17 MED ORDER — THIAMINE HCL 100 MG/ML IJ SOLN: 100.0000 mg | Freq: Every day | INTRAMUSCULAR | Status: DC

## 2018-08-17 MED ORDER — SULINDAC 150 MG PO TABS
150.0000 mg | ORAL_TABLET | Freq: Two times a day (BID) | ORAL | Status: DC
  Administered 2018-08-17: 150 mg via ORAL
  Filled 2018-08-17 (×2): qty 1

## 2018-08-17 MED ORDER — VITAMIN B-1 100 MG PO TABS
100.0000 mg | ORAL_TABLET | Freq: Every day | ORAL | Status: DC
  Administered 2018-08-17 – 2018-08-22 (×6): 100 mg via ORAL
  Filled 2018-08-17 (×6): qty 1

## 2018-08-17 MED ORDER — ENOXAPARIN SODIUM 40 MG/0.4ML ~~LOC~~ SOLN
40.0000 mg | SUBCUTANEOUS | Status: DC
  Administered 2018-08-17: 40 mg via SUBCUTANEOUS
  Filled 2018-08-17: qty 0.4

## 2018-08-17 MED ORDER — CLINDAMYCIN PHOSPHATE 600 MG/50ML IV SOLN
600.0000 mg | Freq: Once | INTRAVENOUS | Status: AC
  Administered 2018-08-17: 600 mg via INTRAVENOUS
  Filled 2018-08-17: qty 50

## 2018-08-17 MED ORDER — ALBUTEROL SULFATE (2.5 MG/3ML) 0.083% IN NEBU: 2.5000 mg | INHALATION_SOLUTION | RESPIRATORY_TRACT | Status: DC | PRN

## 2018-08-17 MED ORDER — ACETAMINOPHEN 500 MG PO TABS
1000.0000 mg | ORAL_TABLET | Freq: Once | ORAL | Status: AC
  Administered 2018-08-17: 1000 mg via ORAL
  Filled 2018-08-17: qty 2

## 2018-08-17 MED ORDER — ACETAMINOPHEN 650 MG RE SUPP: 650.0000 mg | Freq: Four times a day (QID) | RECTAL | Status: DC | PRN

## 2018-08-17 MED ORDER — PRESERVISION AREDS 2 PO CAPS: 1.0000 | ORAL_CAPSULE | Freq: Every day | ORAL | Status: DC

## 2018-08-17 MED ORDER — SODIUM CHLORIDE 0.9 % IV SOLN
500.0000 mg | Freq: Once | INTRAVENOUS | Status: AC
  Administered 2018-08-17: 500 mg via INTRAVENOUS
  Filled 2018-08-17: qty 500

## 2018-08-17 MED ORDER — PENICILLIN G BENZATHINE 1200000 UNIT/2ML IM SUSP
1.2000 10*6.[IU] | Freq: Once | INTRAMUSCULAR | Status: AC
  Administered 2018-08-17: 1.2 10*6.[IU] via INTRAMUSCULAR
  Filled 2018-08-17: qty 2

## 2018-08-17 MED ORDER — LORAZEPAM 1 MG PO TABS: 1.0000 mg | ORAL_TABLET | Freq: Four times a day (QID) | ORAL | Status: AC | PRN

## 2018-08-17 MED ORDER — ACETAMINOPHEN 325 MG PO TABS
650.0000 mg | ORAL_TABLET | Freq: Four times a day (QID) | ORAL | Status: DC | PRN
  Administered 2018-08-17 – 2018-08-21 (×6): 650 mg via ORAL
  Filled 2018-08-17 (×5): qty 2

## 2018-08-17 MED ORDER — SODIUM CHLORIDE 0.9% FLUSH
3.0000 mL | Freq: Two times a day (BID) | INTRAVENOUS | Status: DC
  Administered 2018-08-17 – 2018-08-22 (×8): 3 mL via INTRAVENOUS

## 2018-08-17 MED ORDER — LATANOPROST 0.005 % OP SOLN
1.0000 [drp] | Freq: Every day | OPHTHALMIC | Status: DC
  Administered 2018-08-18 – 2018-08-21 (×4): 1 [drp] via OPHTHALMIC
  Filled 2018-08-17: qty 2.5

## 2018-08-17 MED ORDER — ASPIRIN EC 81 MG PO TBEC
81.0000 mg | DELAYED_RELEASE_TABLET | Freq: Every day | ORAL | Status: DC
  Administered 2018-08-17 – 2018-08-19 (×3): 81 mg via ORAL
  Filled 2018-08-17 (×3): qty 1

## 2018-08-17 MED ORDER — FOLIC ACID 1 MG PO TABS
1.0000 mg | ORAL_TABLET | Freq: Every day | ORAL | Status: DC
  Administered 2018-08-17 – 2018-08-22 (×5): 1 mg via ORAL
  Filled 2018-08-17 (×6): qty 1

## 2018-08-17 MED ORDER — SODIUM CHLORIDE 0.9 % IV SOLN
1.0000 g | Freq: Once | INTRAVENOUS | Status: AC
  Administered 2018-08-17: 1 g via INTRAVENOUS
  Filled 2018-08-17: qty 10

## 2018-08-17 MED ORDER — GABAPENTIN 300 MG PO CAPS
300.0000 mg | ORAL_CAPSULE | Freq: Two times a day (BID) | ORAL | Status: DC
  Administered 2018-08-17 – 2018-08-22 (×10): 300 mg via ORAL
  Filled 2018-08-17 (×10): qty 1

## 2018-08-17 MED ORDER — SODIUM CHLORIDE 0.9 % IV SOLN: 1.0000 g | INTRAVENOUS | Status: DC

## 2018-08-17 MED ORDER — LORAZEPAM 2 MG/ML IJ SOLN: 1.0000 mg | Freq: Four times a day (QID) | INTRAMUSCULAR | Status: AC | PRN

## 2018-08-17 MED ORDER — ADULT MULTIVITAMIN W/MINERALS CH
1.0000 | ORAL_TABLET | Freq: Every day | ORAL | Status: DC
  Administered 2018-08-17 – 2018-08-22 (×5): 1 via ORAL
  Filled 2018-08-17 (×6): qty 1

## 2018-08-17 NOTE — ED Provider Notes (Signed)
Received patient at signout from [facility].  Refer to provider note for full history and physical examination.  Briefly, patient is a 72 year old male with history of hypertension, CKD presents for evaluation of chills.  He was sent from his PCPs office found to be tachycardic and short of breath.  He was apparently in A. fib with RVR while in the office, seen on EKG sent from the PCP office with the patient.  Tested positive for strep, chest x-ray here with possible pneumonia.  He has received Tylenol for fever, IM penicillin, Rocephin, azithromycin.  While in the ED he became hypotensive with systolic blood pressure in the 80s.  This has improved with fluids. Given clindamycin for broader coverage. Sepsis work-up pending.  Anticipate admission to the hospital.  Physical Exam  BP 95/64   Pulse 83   Temp (!) 102.9 F (39.4 C) (Oral)   Resp 20   Ht 6\' 3"  (1.905 m)   Wt 99.8 kg   SpO2 94%   BMI 27.50 kg/m   Physical Exam  Constitutional: He appears well-developed and well-nourished. No distress.  HENT:  Head: Normocephalic and atraumatic.  Eyes: Conjunctivae are normal. Right eye exhibits no discharge. Left eye exhibits no discharge.  Neck: No JVD present. No tracheal deviation present.  Cardiovascular: Normal rate, regular rhythm and normal heart sounds.  Pulmonary/Chest: Effort normal.  Abdominal: Soft. Bowel sounds are normal. He exhibits no distension. There is no tenderness.  Musculoskeletal: He exhibits no edema.  Neurological: He is alert.  Skin: Skin is warm and dry. No erythema.  Psychiatric: He has a normal mood and affect. His behavior is normal.  Nursing note and vitals reviewed.  MDM  Lactate within normal limits.  UA does not suggest UTI.  Curb-65 score of 3 placing the patient in the severe risk group with 14% 30-day mortality.  On reevaluation patient is resting comfortably.  Blood pressure has improved. Spoke with Dr. Algis Liming with [facility] hospitalist service who agrees to  assume care of patient and bring him into the hospital for further evaluation and management.      Renita Papa, PA-C 08/17/18 1807    Hayden Rasmussen, MD 08/17/18 [phone]

## 2018-08-17 NOTE — ED Triage Notes (Signed)
Pt states he has had chills for the past three nights, treating with tylenol. Pt covered in rash over abd/back. Pt feeling SOB today. Pt went to [facility] today and was diagnosed with ear infection and strep throat. Pt sent here from [facility]- HR 170's. Pt arrived to this ED room with HR 108.

## 2018-08-17 NOTE — ED Provider Notes (Addendum)
[facility] EMERGENCY DEPARTMENT Provider Note   CSN: [id number] Arrival date & time: 08/17/18  1311     History   Chief Complaint Chief Complaint  Patient presents with  . Sore Throat  . Fever  . Tachycardia    HPI Ricky Singh is a 72 y.o. male who was was sent to the emergency department by his primary care physician for tachycardia, fever and shortness of breath.  The patient has had 3 days of intermittent fever.  He denies ear pain or sore throat however has a history of recurrent strep pharyngitis that in the past has not caused him pain. Patient was in afib with rvr to a rate of 167 on EKG sent in by pcp. He felt short of breath today.  HPI  Past Medical History:  Diagnosis Date  . Allergy   . Ankle fracture, left   . Arthritis   . Cataract   . Chronic kidney disease (CKD), stage III (moderate) (HCC)   . Femur fracture, right ([place])   . Glaucoma   . Hypertension   . Macular degeneration   . Scoliosis     There are no active problems to display for this patient.   Past Surgical History:  Procedure Laterality Date  . COLONOSCOPY    . DENTAL SURGERY    . [place]  . TONSILLECTOMY  1951   or 1952        Home Medications    Prior to Admission medications   Medication Sig Start Date End Date Taking? Authorizing Provider  aspirin 81 MG tablet Take 81 mg by mouth daily.    [provider]  doxazosin (CARDURA) 4 MG tablet Take 4 mg by mouth at bedtime.    [provider]  gabapentin (NEURONTIN) 300 MG capsule Take 300 mg by mouth daily as needed.    [provider]  losartan (COZAAR) 100 MG tablet Take 100 mg by mouth daily.    [provider]  Multiple Vitamins-Minerals (ICAPS AREDS FORMULA PO) Take 2 tablets by mouth daily.    [provider]  Multiple Vitamins-Minerals (PRESERVISION AREDS 2) CAPS Take 1 capsule by mouth daily.    [provider]  Omega-3 Fatty  Acids (FISH OIL) 1200 MG CAPS Take 1 capsule by mouth daily.    [provider]  sulindac (CLINORIL) 150 MG tablet Take 150 mg by mouth 2 (two) times daily.    [provider]  TRAMADOL HCL ER PO Take 1 tablet by mouth 2 (two) times daily.    [provider]  vardenafil (LEVITRA) 20 MG tablet Take 20 mg by mouth daily as needed for erectile dysfunction.    [provider]    Family History Family History  Problem Relation Age of Onset  . Lymphoma Father 38       started in testicles  . Hypertension Mother   . Angina Maternal Grandfather   . Kidney cancer Cousin   . Colon cancer Neg Hx   . Esophageal cancer Neg Hx   . Rectal cancer Neg Hx   . Stomach cancer Neg Hx     Social History Social History   Tobacco Use  . Smoking status: Former Research scientist (life sciences)  . Smokeless tobacco: Never Used  Substance Use Topics  . Alcohol use: Yes    Alcohol/week: 14.0 standard drinks    Types: 14 Cans of beer per week    Comment: 2 beers nightly  .  Drug use: No     Allergies   Patient has no known allergies.   Review of Systems Review of Systems  Constitutional: Positive for chills and fever.  Respiratory: Positive for shortness of breath.   Cardiovascular:       Racing heart  All other systems reviewed and are negative.   Ten systems reviewed and are negative for acute change, except as noted in the HPI.   Physical Exam Updated Vital Signs BP 110/64 (BP Location: Right Arm)   Temp (!) 102.9 F (39.4 C) (Oral)   Resp (!) 23   SpO2 94%   Physical Exam  Constitutional: He appears well-developed and well-nourished. No distress.  HENT:  Head: Normocephalic and atraumatic.  Erythematous throat with exudate on the tonsils.  Uvula midline.  Cervical adenopathy  Eyes: Conjunctivae are normal. No scleral icterus.  Neck: Normal range of motion. Neck supple.  Cardiovascular: Normal rate, regular rhythm and normal heart sounds.  Pulmonary/Chest: Effort  normal and breath sounds normal. No respiratory distress.  Abdominal: Soft. There is no tenderness.  Musculoskeletal: He exhibits no edema.  Neurological: He is alert.  Skin: Skin is warm and dry. Capillary refill takes less than 2 seconds. He is not diaphoretic.  Fine erythematous morbilliform rash over the back and abdomen  Psychiatric: His behavior is normal.  Nursing note and vitals reviewed.    ED Treatments / Results  Labs (all labs ordered are listed, but only abnormal results are displayed) Labs Reviewed - No data to display  EKG EKG Interpretation  Date/Time:  Tuesday August 17 2018 13:18:44 EST Ventricular Rate:  106 PR Interval:    QRS Duration: 85 QT Interval:  295 QTC Calculation: 392 R Axis:   56 Text Interpretation:  Sinus tachycardia RSR' in V1 or V2, right VCD or RVH No previous ECGs available Confirmed by Gareth Morgan [phone]) on 08/17/2018 1:22:06 PM   Radiology No results found.  Procedures Procedures (including critical care time)  Medications Ordered in ED Medications  acetaminophen (TYLENOL) tablet 1,000 mg (has no administration in time range)     Initial Impression / Assessment and Plan / ED Course  I have reviewed the triage vital signs and the nursing notes.  Pertinent labs & imaging results that were available during my care of the patient were reviewed by me and considered in my medical decision making ([place] chart for details).     Check with apparent diagnosis of strep throat this evening with erythematous morbilliform rash consistent with scarlet fever.  Patient treated with IM penicillin and fluids.  During this time his blood pressures began to drop and his chest x-ray showed a pneumonia.  Patient seen by Dr. Billy Fischer who added broader antibiotic coverage and with the erythematous rash she had concern for potential erythroderma in the setting of strep infection.  The patient was improving.  I have given sign out to [facility] who  will disposition the patient appropriately  Final Clinical Impressions(s) / ED Diagnoses   Final diagnoses:  None    ED Discharge Orders    None       Margarita Mail, PA-C 08/17/18 1746    Margarita Mail, PA-C 08/17/18 1804    Gareth Morgan, MD 08/18/18 [phone]

## 2018-08-17 NOTE — ED Notes (Signed)
Heart healthy dinner ordered from service response

## 2018-08-17 NOTE — H&P (Signed)
History and Physical    Ricky Singh KOE:[id number] DOB: 07/09/1946 DOA: 08/17/2018  PCP: Leanna Battles, MD   I have briefly reviewed patients previous medical reports in [facility].  Patient coming from: Home via PCPs office.  Chief Complaint: Fever and chills.  HPI: Ricky Singh is a pleasant 72 year old married male, independent, PMH of HTN, stage III chronic kidney disease, chronic low back pain, glaucoma, presented to [facility] ED via PCPs office for complaints of fever and chills, diagnosed with strep throat at PCPs office and noted to have asymptomatic A. fib with RVR on EKG done in the office.  Patient and spouse at bedside provided history.  He was in usual state of health until 11/16 when he started experiencing chills and feverishness.  He did not check his temperature.  Apart from chronic intermittent stuffiness of his ears, he denied any other complaints.  He denied headache, earache, sore throat, difficulty swallowing, runny nose, watery eyes, cough, dyspnea, chest pain, palpitations, dizziness, lightheadedness, nausea, vomiting, diarrhea or urinary symptoms.  No known exposure to sick contacts.  Denied noticing skin rash at home.  Received flu shot in September of this year and pneumonia shot couple years ago.  He took Tylenol and his fevers would break with sweats only to return.  Since he was not improving, he decided to go to PCPs office where throat swab tested positive for strep throat but randomly performed EKG showed A. fib with RVR.  Patient was then instructed to come to the ED for further evaluation.  Patient states that it is not unusual for him to have a strep throat without pain or other symptoms.  He clearly indicated that rash was noted in the ED prior to administration of several antibiotics.  ED Course: Patient noted to be febrile 102.9 F, tachypneic 25 bpm, tachycardic 105/min.  Lab work showed platelets of 121, creatinine of 1.59 with unknown baseline, negative  urine microscopy, normal lactate, chest x-ray showing COPD and patchy density in the right upper lobe which may reflect the first costochondral junction but a patchy infiltrate or mass is not excluded and repeat chest x-ray versus CT chest was recommended for further evaluation.  He received a dose of IM benzathine penicillin.  Per EDP report, he subsequently became hypotensive with SBP in the 80s and was bolused with IV fluids and then received IV ceftriaxone, azithromycin and clindamycin for suspected toxic shock syndrome.  Currently patient feels much better and denies complaints.  Review of Systems:  All other systems reviewed and apart from HPI, are negative.  Past Medical History:  Diagnosis Date  . Allergy   . Ankle fracture, left   . Arthritis   . Cataract   . Chronic kidney disease (CKD), stage III (moderate) (HCC)   . Femur fracture, right ([place])   . Glaucoma   . Hypertension   . Macular degeneration   . Scoliosis     Past Surgical History:  Procedure Laterality Date  . COLONOSCOPY    . DENTAL SURGERY    . [place]  . TONSILLECTOMY  1951   or 1952    Social History  reports that he has quit smoking. He has never used smokeless tobacco. He reports that he drinks about 14.0 standard drinks of alcohol per week. He reports that he does not use drugs.  No Known Allergies  Family History  Problem Relation Age of Onset  . Lymphoma Father 40  started in testicles  . Hypertension Mother   . Angina Maternal Grandfather   . Kidney cancer Cousin   . Colon cancer Neg Hx   . Esophageal cancer Neg Hx   . Rectal cancer Neg Hx   . Stomach cancer Neg Hx      Prior to Admission medications   Medication Sig Start Date End Date Taking? Authorizing Provider  aspirin 81 MG tablet Take 81 mg by mouth daily.   Yes [provider]  brimonidine (ALPHAGAN) 0.15 % ophthalmic solution Place 1 drop into the right eye 2 (two) times daily. 06/07/18  Yes  [provider]  doxazosin (CARDURA) 4 MG tablet Take 4 mg by mouth at bedtime.   Yes [provider]  gabapentin (NEURONTIN) 300 MG capsule Take 300 mg by mouth 2 (two) times daily.    Yes [provider]  latanoprost (XALATAN) 0.005 % ophthalmic solution Place 1 drop into both eyes daily. 06/07/18  Yes [provider]  losartan (COZAAR) 100 MG tablet Take 100 mg by mouth daily.   Yes [provider]  Multiple Vitamins-Minerals (PRESERVISION AREDS 2) CAPS Take 1 capsule by mouth daily.   Yes [provider]  Omega-3 Fatty Acids (FISH OIL) 1200 MG CAPS Take 1 capsule by mouth 2 (two) times daily.    Yes [provider]  sulindac (CLINORIL) 150 MG tablet Take 150 mg by mouth 2 (two) times daily.   Yes [provider]  vardenafil (LEVITRA) 20 MG tablet Take 20 mg by mouth daily as needed for erectile dysfunction.   Yes [provider]    Physical Exam: Vitals:   08/17/18 1615 08/17/18 1645 08/17/18 1719 08/17/18 1719  BP: 93/61 108/67 115/71   Pulse: 77 68 75   Resp: (!) '24 16 18   ' Temp:    98.5 F (36.9 C)  TempSrc:    Oral  SpO2: 96% 95% 97%   Weight:      Height:          Constitutional: Pleasant elderly male, moderately built and nourished lying comfortably propped up in bed.  Oral mucosa mildly dry. Eyes: PERTLA, lids and conjunctivae normal. ENMT: Mucous membranes are mildly dry. Posterior pharynx shows mild patchy posterior pharyngeal erythema without exudate or ulcers. Normal dentition.  Otoscopy at bedside: Left ear normal and unremarkable.  Right ear shows significant cerumen in canal and unable to [place] the middle ear. Neck: supple, no masses, no thyromegaly Respiratory: clear to auscultation bilaterally, no wheezing, no crackles. Normal respiratory effort. No accessory muscle use.  Cardiovascular: S1 & S2 heard, regular rate and rhythm, no murmurs / rubs / gallops. No extremity edema. 2+ pedal  pulses. No carotid bruits.  Abdomen: No distension, no tenderness, no masses palpated. No hepatosplenomegaly. Bowel sounds normal.  Musculoskeletal: no clubbing / cyanosis. No joint deformity upper and lower extremities. Good ROM, no contractures. Normal muscle tone.  Skin: Faint morbilliform rash noted on trunk anteriorly and posteriorly but not on extremities or face. Neurologic: CN 2-12 grossly intact. Sensation intact, DTR normal. Strength 5/5 in all 4 limbs.  Psychiatric: Normal judgment and insight. Alert and oriented x 3. Normal mood.     Labs on Admission: I have personally reviewed following labs and imaging studies  CBC: Recent Labs  Lab 08/17/18 1332  WBC 4.7  NEUTROABS 3.9  HGB 13.5  HCT 42.0  MCV 93.1  PLT 828*   Basic Metabolic Panel: Recent Labs  Lab 08/17/18 1332  NA  136  K 4.2  CL 110  CO2 18*  GLUCOSE 123*  BUN 21  CREATININE 1.59*  CALCIUM 8.4*   Urine analysis:    Component Value Date/Time   COLORURINE YELLOW 08/17/2018 [place] 08/17/2018 1608   LABSPEC 1.015 08/17/2018 1608   PHURINE 5.0 08/17/2018 1608   GLUCOSEU NEGATIVE 08/17/2018 1608   HGBUR NEGATIVE 08/17/2018 1608   BILIRUBINUR NEGATIVE 08/17/2018 1608   KETONESUR NEGATIVE 08/17/2018 1608   PROTEINUR NEGATIVE 08/17/2018 1608   NITRITE NEGATIVE 08/17/2018 1608   LEUKOCYTESUR NEGATIVE 08/17/2018 1608     Radiological Exams on Admission: Dg Chest Port 1 View  Result Date: 08/17/2018 CLINICAL DATA:  Chills and shortness of breath for the past 3 days. Remote history of smoking. EXAM: PORTABLE CHEST 1 VIEW COMPARISON:  None in PACs FINDINGS: The lungs are well-expanded. The interstitial markings are coarse. There is patchy density in the right upper lobe that may be related to the costochondral junction of the first rib. The heart and pulmonary vascularity are normal. There is no pleural effusion. IMPRESSION: COPD. Patchy density in the right upper lobe may reflect the  first costochondral junction but a parenchymal infiltrate or mass is not excluded. At minimum an apical lordotic chest x-ray is recommended. Chest CT scanning may be ultimately needed to exclude occult malignancy. Electronically Signed   By: David  Martinique M.D.   On: 08/17/2018 13:50    EKG: Independently reviewed.  Reviewed EKG from PCPs office: Noted A. fib with RVR at 167 bpm, normal axis, RBBB (unclear if new or not) and no acute findings.  Repeat EKG in ED: Sinus tachycardia at 106 bpm, possible RBBB, normal axis, and no acute findings.  QTc 392 ms.  Assessment/Plan Principal Problem:   Sepsis ([place]) Active Problems:   Strep pharyngitis with scarlet fever   Atrial fibrillation with RVR (HCC)   CKD (chronic kidney disease), stage III ([place])     1. Sepsis secondary to streptococcal pharyngitis with scarlet fever: Patient met sepsis criteria on admission.  Although chest x-ray reported as above, clinically not consistent with pneumonia.  Flu panel PCR negative.  Lactate normal.  Follow blood culture results that were sent from the ED.  Treated with IV fluid bolus in ED for transient hypotension which has now resolved.  Patient status post dose of IM benzathine penicillin, IV ceftriaxone, IV azithromycin and IV clindamycin.  I discussed with ID MD on call and based on his strep pharyngitis, morbilliform rash, suspect scarlet fever and recommend continuing IV ceftriaxone alone.  Patient has quickly improved.  Monitor closely. 2. Skin rash: Possibly due to strep.  Management as above. 3. A. fib with RVR: Noted in PCPs office by EKG.  Patient remained asymptomatic throughout.  Denies prior cardiac history.  Observe on telemetry.  Check TTE and TSH.  This may be 1 off event related to sepsis physiology.  Consider discussing with or consulting Cardiology in a.m. 4. Essential hypertension/hypotension: Patient was transiently hypotensive in the ED with SBP in the 80s.  Suspect due to sepsis physiology and  home antihypertensives.  Improved after IV fluid boluses.  Temporarily hold antihypertensives.  Continue IV fluids and reassess in a.m. 5. Stage III chronic kidney disease: Baseline creatinine not known.  Presented with creatinine of 1.5.  Hold ARB, hydrate with IV fluids and follow BMP in a.m. 6. Thrombocytopenia: Baseline not available.  Not sure if this is old or new.  Follow CBC in a.m.  No bleeding reported.  7. Alcohol use: Patient reports consuming up to 2 beers daily and denies hard liquor.  Unsure if thrombocytopenia is related to this.  Check LFTs and placed on CIWA protocol. 8. Chronic low back pain: Continue sulindac. 9. Glaucoma: Continue home medications.   DVT prophylaxis: Lovenox Code Status: Full, confirmed in the presence of his spouse. Family Communication: Discussed in detail with patient spouse at bedside.  Updated care and answered questions. Disposition Plan: DC home pending clinical improvement. Consults called: None. Admission status: Telemetry, observation.  Severity of Illness: The appropriate patient status for this patient is OBSERVATION. Observation status is judged to be reasonable and necessary in order to provide the required intensity of service to ensure the patient's safety. The patient's presenting symptoms, physical exam findings, and initial radiographic and laboratory data in the context of their medical condition is felt to place them at decreased risk for further clinical deterioration. Furthermore, it is anticipated that the patient will be medically stable for discharge from the hospital within 2 midnights of admission. The following factors support the patient status of observation.   " The patient's presenting symptoms include fever, chills and skin rash. " The physical exam findings include fever, tachycardia, transient hypotension, tachypnea, congested throat, " The initial radiographic and laboratory data are thrombocytopenia, creatinine of 1.5,  normal lactate, chest x-ray findings as above.      Vernell Leep MD [facility] Pager [phone]  If 7PM-7AM, please contact night-coverage [email or web address] Password [email or web address]  08/17/2018, 6:50 PM

## 2018-08-18 ENCOUNTER — Inpatient Hospital Stay (HOSPITAL_COMMUNITY): Payer: Medicare Other

## 2018-08-18 ENCOUNTER — Observation Stay (HOSPITAL_BASED_OUTPATIENT_CLINIC_OR_DEPARTMENT_OTHER): Payer: Medicare Other

## 2018-08-18 ENCOUNTER — Encounter (HOSPITAL_COMMUNITY): Payer: Self-pay | Admitting: Radiology

## 2018-08-18 ENCOUNTER — Observation Stay (HOSPITAL_COMMUNITY): Payer: Medicare Other

## 2018-08-18 ENCOUNTER — Other Ambulatory Visit: Payer: Self-pay

## 2018-08-18 DIAGNOSIS — R509 Fever, unspecified: Secondary | ICD-10-CM | POA: Diagnosis not present

## 2018-08-18 DIAGNOSIS — H409 Unspecified glaucoma: Secondary | ICD-10-CM | POA: Diagnosis not present

## 2018-08-18 DIAGNOSIS — A400 Sepsis due to streptococcus, group A: Secondary | ICD-10-CM | POA: Diagnosis present

## 2018-08-18 DIAGNOSIS — Z87891 Personal history of nicotine dependence: Secondary | ICD-10-CM | POA: Diagnosis not present

## 2018-08-18 DIAGNOSIS — R402360 Coma scale, best motor response, obeys commands, unspecified time: Secondary | ICD-10-CM | POA: Diagnosis present

## 2018-08-18 DIAGNOSIS — I370 Nonrheumatic pulmonary valve stenosis: Secondary | ICD-10-CM

## 2018-08-18 DIAGNOSIS — M545 Low back pain: Secondary | ICD-10-CM | POA: Diagnosis present

## 2018-08-18 DIAGNOSIS — I480 Paroxysmal atrial fibrillation: Secondary | ICD-10-CM | POA: Diagnosis not present

## 2018-08-18 DIAGNOSIS — M419 Scoliosis, unspecified: Secondary | ICD-10-CM | POA: Diagnosis present

## 2018-08-18 DIAGNOSIS — R21 Rash and other nonspecific skin eruption: Secondary | ICD-10-CM | POA: Diagnosis not present

## 2018-08-18 DIAGNOSIS — I4891 Unspecified atrial fibrillation: Secondary | ICD-10-CM | POA: Diagnosis not present

## 2018-08-18 DIAGNOSIS — R402140 Coma scale, eyes open, spontaneous, unspecified time: Secondary | ICD-10-CM | POA: Diagnosis present

## 2018-08-18 DIAGNOSIS — J189 Pneumonia, unspecified organism: Secondary | ICD-10-CM | POA: Diagnosis present

## 2018-08-18 DIAGNOSIS — N183 Chronic kidney disease, stage 3 (moderate): Secondary | ICD-10-CM | POA: Diagnosis present

## 2018-08-18 DIAGNOSIS — R502 Drug induced fever: Secondary | ICD-10-CM | POA: Diagnosis not present

## 2018-08-18 DIAGNOSIS — I129 Hypertensive chronic kidney disease with stage 1 through stage 4 chronic kidney disease, or unspecified chronic kidney disease: Secondary | ICD-10-CM | POA: Diagnosis present

## 2018-08-18 DIAGNOSIS — I4819 Other persistent atrial fibrillation: Secondary | ICD-10-CM | POA: Diagnosis present

## 2018-08-18 DIAGNOSIS — G8929 Other chronic pain: Secondary | ICD-10-CM | POA: Diagnosis present

## 2018-08-18 DIAGNOSIS — J020 Streptococcal pharyngitis: Secondary | ICD-10-CM | POA: Diagnosis present

## 2018-08-18 DIAGNOSIS — D696 Thrombocytopenia, unspecified: Secondary | ICD-10-CM | POA: Diagnosis present

## 2018-08-18 DIAGNOSIS — Z22338 Carrier of other streptococcus: Secondary | ICD-10-CM | POA: Diagnosis not present

## 2018-08-18 DIAGNOSIS — A483 Toxic shock syndrome: Secondary | ICD-10-CM | POA: Diagnosis present

## 2018-08-18 DIAGNOSIS — F1010 Alcohol abuse, uncomplicated: Secondary | ICD-10-CM | POA: Diagnosis present

## 2018-08-18 DIAGNOSIS — J90 Pleural effusion, not elsewhere classified: Secondary | ICD-10-CM | POA: Diagnosis not present

## 2018-08-18 DIAGNOSIS — D631 Anemia in chronic kidney disease: Secondary | ICD-10-CM | POA: Diagnosis present

## 2018-08-18 DIAGNOSIS — J449 Chronic obstructive pulmonary disease, unspecified: Secondary | ICD-10-CM | POA: Diagnosis not present

## 2018-08-18 DIAGNOSIS — R918 Other nonspecific abnormal finding of lung field: Secondary | ICD-10-CM | POA: Diagnosis not present

## 2018-08-18 DIAGNOSIS — R590 Localized enlarged lymph nodes: Secondary | ICD-10-CM | POA: Diagnosis present

## 2018-08-18 DIAGNOSIS — A389 Scarlet fever, uncomplicated: Secondary | ICD-10-CM | POA: Diagnosis present

## 2018-08-18 DIAGNOSIS — Z79899 Other long term (current) drug therapy: Secondary | ICD-10-CM | POA: Diagnosis not present

## 2018-08-18 DIAGNOSIS — R402250 Coma scale, best verbal response, oriented, unspecified time: Secondary | ICD-10-CM | POA: Diagnosis present

## 2018-08-18 DIAGNOSIS — J209 Acute bronchitis, unspecified: Secondary | ICD-10-CM | POA: Diagnosis present

## 2018-08-18 LAB — ECHOCARDIOGRAM COMPLETE
Height: 75 in
Weight: 3647.29 oz

## 2018-08-18 LAB — BASIC METABOLIC PANEL
Anion gap: 7 (ref 5–15)
BUN: 22 mg/dL (ref 8–23)
CHLORIDE: 113 mmol/L — AB (ref 98–111)
CO2: 20 mmol/L — ABNORMAL LOW (ref 22–32)
Calcium: 8 mg/dL — ABNORMAL LOW (ref 8.9–10.3)
Creatinine, Ser: 1.46 mg/dL — ABNORMAL HIGH (ref 0.61–1.24)
GFR calc non Af Amer: 46 mL/min — ABNORMAL LOW (ref >60)
GFR, EST AFRICAN AMERICAN: 53 mL/min — AB (ref >60)
Glucose, Bld: 111 mg/dL — ABNORMAL HIGH (ref 70–99)
POTASSIUM: 4.1 mmol/L (ref 3.5–5.1)
SODIUM: 140 mmol/L (ref 135–145)

## 2018-08-18 LAB — RESPIRATORY PANEL BY PCR
Adenovirus: NOT DETECTED
BORDETELLA PERTUSSIS-RVPCR: NOT DETECTED
CORONAVIRUS 229E-RVPPCR: NOT DETECTED
Chlamydophila pneumoniae: NOT DETECTED
Coronavirus HKU1: NOT DETECTED
Coronavirus NL63: NOT DETECTED
Coronavirus OC43: NOT DETECTED
INFLUENZA B-RVPPCR: NOT DETECTED
Influenza A: NOT DETECTED
MYCOPLASMA PNEUMONIAE-RVPPCR: NOT DETECTED
Metapneumovirus: NOT DETECTED
Parainfluenza Virus 1: NOT DETECTED
Parainfluenza Virus 2: NOT DETECTED
Parainfluenza Virus 3: NOT DETECTED
Parainfluenza Virus 4: NOT DETECTED
RESPIRATORY SYNCYTIAL VIRUS-RVPPCR: NOT DETECTED
Rhinovirus / Enterovirus: NOT DETECTED

## 2018-08-18 LAB — CBC
HCT: 34.7 % — ABNORMAL LOW (ref 39.0–52.0)
Hemoglobin: 11.5 g/dL — ABNORMAL LOW (ref 13.0–17.0)
MCH: 30.7 pg (ref 26.0–34.0)
MCHC: 33.1 g/dL (ref 30.0–36.0)
MCV: 92.8 fL (ref 80.0–100.0)
Platelets: 94 10*3/uL — ABNORMAL LOW (ref 150–400)
RBC: 3.74 MIL/uL — ABNORMAL LOW (ref 4.22–5.81)
RDW: 12.9 % (ref 11.5–15.5)
WBC: 3.2 10*3/uL — ABNORMAL LOW (ref 4.0–10.5)
nRBC: 0 % (ref 0.0–0.2)

## 2018-08-18 LAB — URINE CULTURE: Culture: NO GROWTH

## 2018-08-18 MED ORDER — DIGOXIN 0.25 MG/ML IJ SOLN: 0.5000 mg | Freq: Once | INTRAMUSCULAR | Status: DC

## 2018-08-18 MED ORDER — METOPROLOL TARTRATE 5 MG/5ML IV SOLN
5.0000 mg | Freq: Once | INTRAVENOUS | Status: AC
  Administered 2018-08-18: 5 mg via INTRAVENOUS
  Filled 2018-08-18: qty 5

## 2018-08-18 MED ORDER — DILTIAZEM LOAD VIA INFUSION
20.0000 mg | Freq: Once | INTRAVENOUS | Status: AC
  Administered 2018-08-18: 20 mg via INTRAVENOUS
  Filled 2018-08-18: qty 20

## 2018-08-18 MED ORDER — SODIUM CHLORIDE 0.9 % IV SOLN
INTRAVENOUS | Status: AC
  Administered 2018-08-18: 12:00:00 via INTRAVENOUS

## 2018-08-18 MED ORDER — SODIUM CHLORIDE 0.9 % IV SOLN
2.0000 g | INTRAVENOUS | Status: DC
  Administered 2018-08-18 – 2018-08-20 (×3): 2 g via INTRAVENOUS
  Filled 2018-08-18 (×3): qty 20

## 2018-08-18 MED ORDER — DILTIAZEM HCL-DEXTROSE 100-5 MG/100ML-% IV SOLN (PREMIX)
5.0000 mg/h | INTRAVENOUS | Status: DC
  Administered 2018-08-18 – 2018-08-19 (×4): 15 mg/h via INTRAVENOUS
  Filled 2018-08-18 (×5): qty 100

## 2018-08-18 MED ORDER — SODIUM CHLORIDE 0.9 % IV SOLN: INTRAVENOUS | Status: DC

## 2018-08-18 NOTE — Progress Notes (Signed)
  Echocardiogram 2D Echocardiogram has been performed.  Madelaine Etienne 08/18/2018, 11:15 AM

## 2018-08-18 NOTE — Progress Notes (Signed)
Progress Note    Ricky Singh  IFO:[id number] DOB: [birth year]  DOA: 08/17/2018 PCP: Leanna Battles, MD    Brief Narrative:   Medical records reviewed and are as summarized below:  Ricky Singh is an 72 y.o. male PMH of HTN, stage III chronic kidney disease, chronic low back pain, glaucoma, presented to [facility] ED via PCPs office for complaints of fever and chills, diagnosed with strep throat at PCPs office and noted to have asymptomatic A. fib with RVR on EKG done in the office.  Patient and spouse at bedside provided history.  He was in usual state of health until 11/16 when he started experiencing chills and feverishness.  He did not check his temperature.  Apart from chronic intermittent stuffiness of his ears, he denied any other complaints.  He denied headache, earache, sore throat, difficulty swallowing, runny nose, watery eyes, cough, dyspnea, chest pain, palpitations, dizziness, lightheadedness, nausea, vomiting, diarrhea or urinary symptoms.  No known exposure to sick contacts.  Denied noticing skin rash at home.  Received flu shot in September of this year and pneumonia shot couple years ago.  He took Tylenol and his fevers would break with sweats only to return.  Since he was not improving, he decided to go to PCPs office where throat swab tested positive for strep throat but randomly performed EKG showed A. fib with RVR.  Patient was then instructed to come to the ED for further evaluation.  Patient states that it is not unusual for him to have a strep throat without pain or other symptoms.  He clearly indicated that rash was noted in the ED prior to administration of several antibiotics.  Assessment/Plan:   Principal Problem:   Sepsis ([place]) Active Problems:   Strep pharyngitis with scarlet fever   Atrial fibrillation with RVR (HCC)   CKD (chronic kidney disease), stage III (HCC)   Fever  Fever - Flu panel PCR negative -Lactate normal -blood culture NGTD-- will repeat cultures  during episode of fever -no sore throat but was strep positive- ? Colonization -respiratory panel pending to r/o virus -urine clean -no focal lesions/pain to narrow down etiology -LFTs normal-- doubt mono  Lung nodule -CT scan of chest pending  Skin rash:  -?related to strep? -blanching on chest/abdomen and back  Acute A. fib with RVR: -initially had resolved in the ER -resumed today around 2 PM -IV Cardizem -holding on IV heparin as his plts are trending down -CHAD-VASC 2: 2 (age/HTN) -TSH normal -echo: Left ventricle: The cavity size was normal. Systolic function was   normal. The estimated ejection fraction was in the range of 60%   to 65%. Wall motion was normal; there were no regional wall   motion abnormalities. Left ventricular diastolic function   parameters were normal.  Essential hypertension/hypotension:  -continue IVF -hold PO meds  Stage III chronic kidney disease: Baseline creatinine not known.  Presented with creatinine of 1.5.  Hold ARB, hydrate with IV fluids and follow BMP in a.m.  Thrombocytopenia: Baseline not available.  Not sure if this is old or new.  Follow CBC in a.m.  No bleeding reported. -holding on heparin gtt until plts stable  Alcohol use:  -CIWA  Chronic low back pain: PRN meds -if fever continues, may need imaging of this -no recent instumentation  Glaucoma: Continue home medications.    Family Communication/Anticipated D/C date and plan/Code Status   DVT prophylaxis: scd Code Status: Full Code.  Family Communication: wife at bedside Disposition  Plan:    Medical Consultants:    None.     Subjective:   Fever better in the ER but has now re-occurred   Objective:    Vitals:   08/18/18 1445 08/18/18 1500 08/18/18 1519 08/18/18 1600  BP: 135/77 126/88 137/90 (!) 125/94  Pulse:      Resp: 18  19   Temp:   (!) 101.7 F (38.7 C) (!) 102.1 F (38.9 C)  TempSrc:    Oral  SpO2: 98%  99%   Weight:      Height:     6\' 3"  (1.905 m)    Intake/Output Summary (Last 24 hours) at 08/18/2018 1644 Last data filed at 08/18/2018 0645 Gross per 24 hour  Intake 3271.56 ml  Output -  Net 3271.56 ml   Filed Weights   08/17/18 1323 08/17/18 1920  Weight: 99.8 kg 103.4 kg    Exam: Ill appearing Blanchable rash- flat on trunk (chest/back/abdomen)-- not sandpaper like +BS, soft, NT No LE Edema Pleasant and cooperative A+Ox3  Data Reviewed:   I have personally reviewed following labs and imaging studies:  Labs: Labs show the following:   Basic Metabolic Panel: Recent Labs  Lab 08/17/18 1332 08/18/18 0451  NA 136 140  K 4.2 4.1  CL 110 113*  CO2 18* 20*  GLUCOSE 123* 111*  BUN 21 22  CREATININE 1.59* 1.46*  CALCIUM 8.4* 8.0*   GFR Estimated Creatinine Clearance: 58.7 mL/min (A) (by C-G formula based on SCr of 1.46 mg/dL (H)). Liver Function Tests: Recent Labs  Lab 08/17/18 2049  AST 20  ALT 20  ALKPHOS 45  BILITOT 0.5  PROT 5.8*  ALBUMIN 2.7*   No results for input(s): LIPASE, AMYLASE in the last 168 hours. No results for input(s): AMMONIA in the last 168 hours. Coagulation profile No results for input(s): INR, PROTIME in the last 168 hours.  CBC: Recent Labs  Lab 08/17/18 1332 08/18/18 0451  WBC 4.7 3.2*  NEUTROABS 3.9  --   HGB 13.5 11.5*  HCT 42.0 34.7*  MCV 93.1 92.8  PLT 121* 94*   Cardiac Enzymes: No results for input(s): CKTOTAL, CKMB, CKMBINDEX, TROPONINI in the last 168 hours. BNP (last 3 results) No results for input(s): PROBNP in the last 8760 hours. CBG: No results for input(s): GLUCAP in the last 168 hours. D-Dimer: No results for input(s): DDIMER in the last 72 hours. Hgb A1c: No results for input(s): HGBA1C in the last 72 hours. Lipid Profile: No results for input(s): CHOL, HDL, LDLCALC, TRIG, CHOLHDL, LDLDIRECT in the last 72 hours. Thyroid function studies: Recent Labs    08/17/18 2049  TSH 1.412   Anemia work up: No results for input(s):  VITAMINB12, FOLATE, FERRITIN, TIBC, IRON, RETICCTPCT in the last 72 hours. Sepsis Labs: Recent Labs  Lab 08/17/18 1332 08/17/18 1642 08/18/18 0451  WBC 4.7  --  3.2*  LATICACIDVEN  --  0.66  --     Microbiology Recent Results (from the past 240 hour(s))  Blood culture (routine x 2)     Status: None (Preliminary result)   Collection Time: 08/17/18  1:45 PM  Result Value Ref Range Status   Specimen Description BLOOD RIGHT ARM  Final   Special Requests   Final    BOTTLES DRAWN AEROBIC AND ANAEROBIC Blood Culture adequate volume   Culture   Final    NO GROWTH < 24 HOURS Performed at [facility], [address], [place], [place] [address]  Report Status PENDING  Incomplete  Urine culture     Status: None   Collection Time: 08/17/18  4:08 PM  Result Value Ref Range Status   Specimen Description URINE, CLEAN CATCH  Final   Special Requests NONE  Final   Culture   Final    NO GROWTH Performed at [facility], [address], [place], [place] [address]    Report Status 08/18/2018 FINAL  Final  Blood culture (routine x 2)     Status: None (Preliminary result)   Collection Time: 08/17/18  4:30 PM  Result Value Ref Range Status   Specimen Description SITE NOT SPECIFIED  Final   Special Requests   Final    BOTTLES DRAWN AEROBIC AND ANAEROBIC Blood Culture adequate volume   Culture   Final    NO GROWTH < 24 HOURS Performed at [facility], [address], [place], [place] [address]    Report Status PENDING  Incomplete    Procedures and diagnostic studies:  Dg Chest 1 View  Result Date: 08/18/2018 CLINICAL DATA:  Patchy density right upper lobe EXAM: CHEST  1 VIEW COMPARISON:  08/17/2018 FINDINGS: Apical lordotic image demonstrates persistent density in the right apex. This is concerning for apical nodule. Scarring in the lung bases. Heart is borderline in size. Mild hyperinflation. No visible effusions. IMPRESSION: Apical lordotic image demonstrates  persistent density in the right apex concerning for pulmonary nodule. Recommend chest CT for further evaluation. COPD. Electronically Signed   By: Rolm Baptise M.D.   On: 08/18/2018 09:59   Dg Chest Port 1 View  Result Date: 08/17/2018 CLINICAL DATA:  Chills and shortness of breath for the past 3 days. Remote history of smoking. EXAM: PORTABLE CHEST 1 VIEW COMPARISON:  None in PACs FINDINGS: The lungs are well-expanded. The interstitial markings are coarse. There is patchy density in the right upper lobe that may be related to the costochondral junction of the first rib. The heart and pulmonary vascularity are normal. There is no pleural effusion. IMPRESSION: COPD. Patchy density in the right upper lobe may reflect the first costochondral junction but a parenchymal infiltrate or mass is not excluded. At minimum an apical lordotic chest x-ray is recommended. Chest CT scanning may be ultimately needed to exclude occult malignancy. Electronically Signed   By: David  Martinique M.D.   On: 08/17/2018 13:50    Medications:   . aspirin EC  81 mg Oral Daily  . brimonidine  1 drop Right Eye BID  . folic acid  1 mg Oral Daily  . gabapentin  300 mg Oral BID  . latanoprost  1 drop Both Eyes QHS  . multivitamin  1 tablet Oral Daily  . multivitamin with minerals  1 tablet Oral Daily  . sodium chloride flush  3 mL Intravenous Q12H  . thiamine  100 mg Oral Daily   Or  . thiamine  100 mg Intravenous Daily   Continuous Infusions: . sodium chloride 75 mL/hr at 08/18/18 1253  . cefTRIAXone (ROCEPHIN)  IV    . diltiazem (CARDIZEM) infusion 10 mg/hr (08/18/18 1431)     LOS: 1 day   Geradine Girt  [facility]   *Please refer to [email or web address], password [email or web address] to get updated schedule on who will round on this patient, as hospitalists switch teams weekly. If 7PM-7AM, please contact night-coverage at [email or web address], password [email or web address] for any overnight needs.  08/18/2018, 4:44 PM

## 2018-08-19 ENCOUNTER — Encounter (HOSPITAL_COMMUNITY): Payer: Self-pay | Admitting: Cardiology

## 2018-08-19 DIAGNOSIS — I480 Paroxysmal atrial fibrillation: Secondary | ICD-10-CM

## 2018-08-19 LAB — BASIC METABOLIC PANEL
Anion gap: 9 (ref 5–15)
BUN: 17 mg/dL (ref 8–23)
CHLORIDE: 108 mmol/L (ref 98–111)
CO2: 18 mmol/L — ABNORMAL LOW (ref 22–32)
CREATININE: 1.4 mg/dL — AB (ref 0.61–1.24)
Calcium: 7.8 mg/dL — ABNORMAL LOW (ref 8.9–10.3)
GFR calc Af Amer: 56 mL/min — ABNORMAL LOW (ref >60)
GFR calc non Af Amer: 48 mL/min — ABNORMAL LOW (ref >60)
Glucose, Bld: 103 mg/dL — ABNORMAL HIGH (ref 70–99)
Potassium: 3.7 mmol/L (ref 3.5–5.1)
SODIUM: 135 mmol/L (ref 135–145)

## 2018-08-19 LAB — CBC
HCT: 36.1 % — ABNORMAL LOW (ref 39.0–52.0)
HEMOGLOBIN: 11.9 g/dL — AB (ref 13.0–17.0)
MCH: 30.1 pg (ref 26.0–34.0)
MCHC: 33 g/dL (ref 30.0–36.0)
MCV: 91.2 fL (ref 80.0–100.0)
Platelets: 107 10*3/uL — ABNORMAL LOW (ref 150–400)
RBC: 3.96 MIL/uL — ABNORMAL LOW (ref 4.22–5.81)
RDW: 12.9 % (ref 11.5–15.5)
WBC: 4.4 10*3/uL (ref 4.0–10.5)
nRBC: 0 % (ref 0.0–0.2)

## 2018-08-19 LAB — TROPONIN I: Troponin I: <0.03 ng/mL (ref <0.03)

## 2018-08-19 LAB — SEDIMENTATION RATE: SED RATE: 14 mm/h (ref 0–16)

## 2018-08-19 MED ORDER — RIVAROXABAN 20 MG PO TABS
20.0000 mg | ORAL_TABLET | Freq: Every day | ORAL | Status: DC
  Administered 2018-08-19 – 2018-08-21 (×3): 20 mg via ORAL
  Filled 2018-08-19 (×3): qty 1

## 2018-08-19 MED ORDER — METOPROLOL TARTRATE 25 MG PO TABS
25.0000 mg | ORAL_TABLET | Freq: Four times a day (QID) | ORAL | Status: DC
  Administered 2018-08-19 – 2018-08-20 (×3): 25 mg via ORAL
  Filled 2018-08-19 (×3): qty 1

## 2018-08-19 NOTE — Consult Note (Addendum)
Cardiology Consultation:   Patient ID: Ricky Singh MRN: [id number]; DOB: 10/23/1945  Admit date: 08/17/2018 Date of Consult: 08/19/2018  Primary Care Provider: No primary care provider on file. Primary Cardiologist: Dorris Carnes, MD new Primary Electrophysiologist:  None    Patient Profile:   Ricky Singh is a 72 y.o. male with a hx of HTN, CKD-3, macular degeneration who is being seen today for the evaluation of a fib with RVR with an admit for fever and chills, + strep throat and was found to be in a fib with RVR at the request of Dr. [place] Crane now dx with sepsis.  History of Present Illness:   [name] with no piror cardiac disease was admitted 08/17/18 with a fib RVR and strept throat with fever and chills. Also with a rash.  He was not aware of rapid HR.   Pk temp in ER 102.9.  plts were 121, Cr of 1.59.  He did become hypotensive with SBP in the 80s and given IV fluids, and IV ABX.   Resp panel neg. Neg flu. Pt has had episodes of PAF, currently in SR.  Heparin was held due to platelets trending down. 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  No chest pain or dyspnea.  No nodules, + rash  Initial EKG in ER ST at 106 and no acute abnormalities.  I personally reviewed  Follow up EKG 08/18/18 a fib rate of 120  He has been going in and out.  He is on dig and cardizem drip    Telemetry:  Telemetry was personally reviewed and demonstrates:  In and out a fib.   Troponin POC 0.03 Today Na 135, K+ 3.7 BUN 17, Cr 1.40, Ca+ 7.8  Hgb 11.9, HCT 36, plts 107 up from 94 yesterday.   TSH 1.412   CT of chest WO contrast IMPRESSION: 1. Mild bronchial wall thickening seen with bronchitis or reactive airway disease. Small RIGHT pleural effusion. No pneumonia. 2. Mild mediastinal lymphadenopathy may be reactive though, recommend close attention on follow-up CT. 3. **An incidental finding of potential clinical significance has been found. Multiple small  pulmonary nodules. Non-contrast chest CT at 3-6 months is recommended   Echo with EF 60-65% No RWMA  Past Medical History:  Diagnosis Date  . Allergy   . Ankle fracture, left   . Arthritis   . Cataract   . Chronic kidney disease (CKD), stage III (moderate) (HCC)   . Femur fracture, right ([place])   . Glaucoma   . Hypertension   . Macular degeneration   . Scoliosis     Past Surgical History:  Procedure Laterality Date  . COLONOSCOPY    . DENTAL SURGERY    . [place]  . TONSILLECTOMY  1951   or 1952     Home Medications:  Prior to Admission medications   Medication Sig Start Date End Date Taking? Authorizing Provider  aspirin 81 MG tablet Take 81 mg by mouth daily.   Yes [provider]  brimonidine (ALPHAGAN) 0.15 % ophthalmic solution Place 1 drop into the right eye 2 (two) times daily. 06/07/18  Yes [provider]  doxazosin (CARDURA) 4 MG tablet Take 4 mg by mouth at bedtime.   Yes [provider]  gabapentin (NEURONTIN) 300 MG capsule Take 300 mg by mouth 2 (two) times daily.    Yes [provider]  latanoprost (XALATAN) 0.005 % ophthalmic solution  Place 1 drop into both eyes daily. 06/07/18  Yes [provider]  losartan (COZAAR) 100 MG tablet Take 100 mg by mouth daily.   Yes [provider]  Multiple Vitamins-Minerals (PRESERVISION AREDS 2) CAPS Take 1 capsule by mouth daily.   Yes [provider]  Omega-3 Fatty Acids (FISH OIL) 1200 MG CAPS Take 1 capsule by mouth 2 (two) times daily.    Yes [provider]  sulindac (CLINORIL) 150 MG tablet Take 150 mg by mouth 2 (two) times daily.   Yes [provider]  vardenafil (LEVITRA) 20 MG tablet Take 20 mg by mouth daily as needed for erectile dysfunction.   Yes [provider]    Inpatient Medications: Scheduled Meds: . aspirin EC  81 mg Oral Daily  . brimonidine  1 drop Right Eye BID  . digoxin  0.5 mg Intravenous  Once  . folic acid  1 mg Oral Daily  . gabapentin  300 mg Oral BID  . latanoprost  1 drop Both Eyes QHS  . multivitamin  1 tablet Oral Daily  . multivitamin with minerals  1 tablet Oral Daily  . sodium chloride flush  3 mL Intravenous Q12H  . thiamine  100 mg Oral Daily   Or  . thiamine  100 mg Intravenous Daily   Continuous Infusions: . cefTRIAXone (ROCEPHIN)  IV 2 g (08/19/18 1549)  . diltiazem (CARDIZEM) infusion 15 mg/hr (08/19/18 1130)   PRN Meds: acetaminophen **OR** acetaminophen, albuterol, LORazepam **OR** LORazepam  Allergies:   No Known Allergies  Social History:   Social History   Socioeconomic History  . Marital status: Married    Spouse name: Not on file  . Number of children: Not on file  . Years of education: Not on file  . Highest education level: Not on file  Occupational History  . Not on file  Social Needs  . Financial resource strain: Not on file  . Food insecurity:    Worry: Not on file    Inability: Not on file  . Transportation needs:    Medical: Not on file    Non-medical: Not on file  Tobacco Use  . Smoking status: Former Research scientist (life sciences)  . Smokeless tobacco: Never Used  Substance and Sexual Activity  . Alcohol use: Yes    Alcohol/week: 14.0 standard drinks    Types: 14 Cans of beer per week    Comment: 2 beers nightly  . Drug use: No  . Sexual activity: Not on file  Lifestyle  . Physical activity:    Days per week: Not on file    Minutes per session: Not on file  . Stress: Not on file  Relationships  . Social connections:    Talks on phone: Not on file    Gets together: Not on file    Attends religious service: Not on file    Active member of club or organization: Not on file    Attends meetings of clubs or organizations: Not on file    Relationship status: Not on file  . Intimate partner violence:    Fear of current or ex partner: Not on file    Emotionally abused: Not on file    Physically abused: Not on file    Forced sexual  activity: Not on file  Other Topics Concern  . Not on file  Social History Narrative  . Not on file    Family History:    Family History  Problem Relation Age of Onset  .  Lymphoma Father 17       started in testicles  . Hypertension Mother   . Angina Maternal Grandfather   . Kidney cancer Cousin   . Colon cancer Neg Hx   . Esophageal cancer Neg Hx   . Rectal cancer Neg Hx   . Stomach cancer Neg Hx      ROS:  Please [place] the history of present illness.  General:+ sepsis and fevers, no weight changes Skin:+ rash  no ulcers HEENT:no blurred vision, no congestion, + strep throat CV:[place] HPI PUL:[place] HPI GI:no diarrhea constipation or melena, no indigestion GU:no hematuria, no dysuria MS:no joint pain, no claudication Neuro:no syncope, no lightheadedness Endo:no diabetes, no thyroid disease  All other ROS reviewed and negative.     Physical Exam/Data:   Vitals:   08/19/18 1126 08/19/18 1200 08/19/18 1400 08/19/18 1500  BP: 97/63 105/67 103/64 113/71  Pulse: 87     Resp: (!) 23 (!) 21 (!) 24 (!) 23  Temp: 98.9 F (37.2 C)     TempSrc: Oral     SpO2: 98%     Weight:      Height:        Intake/Output Summary (Last 24 hours) at 08/19/2018 1630 Last data filed at 08/19/2018 1300 Gross per 24 hour  Intake 1525 ml  Output 1550 ml  Net -25 ml   Filed Weights   08/17/18 1323 08/17/18 1920 08/19/18 0654  Weight: 99.8 kg 103.4 kg 101.9 kg   Body mass index is 28.09 kg/m.  General:  Well nourished, well developed, in no acute distress HEENT: normal Lymph: no adenopathy Neck: no JVD Endocrine:  No thryomegaly Vascular: No carotid bruits; pedal; pulses 2+ bilaterally  Cardiac:  normal S1, S2; RRR; no murmur gallup rub or click Lungs:  clear to auscultation bilaterally, no wheezing, rhonchi or rales  Abd: soft, nontender, no hepatomegaly  Ext: no edema Musculoskeletal:  No deformities, BUE and BLE strength normal and equal Skin: warm and dry + rash on back and abd,  legs and arms Neuro:  CNs 2-12 intact, no focal abnormalities noted Psych:  Normal affect   Relevant CV Studies: Echo 11/201/9 Study Conclusions  - Left ventricle: The cavity size was normal. Systolic function was   normal. The estimated ejection fraction was in the range of 60%   to 65%. Wall motion was normal; there were no regional wall   motion abnormalities. Left ventricular diastolic function   parameters were normal. - Aortic valve: Valve area (VTI): 2.72 cm^2. Valve area (Vmean):   2.72 cm^2. - Left atrium: The atrium was mildly dilated. - Atrial septum: No defect or patent foramen ovale was identified.   Laboratory Data:  Chemistry Recent Labs  Lab 08/17/18 1332 08/18/18 0451 08/19/18 0425  NA 136 140 135  K 4.2 4.1 3.7  CL 110 113* 108  CO2 18* 20* 18*  GLUCOSE 123* 111* 103*  BUN _0 CREATININE 1.59* 1.46* 1.40*  CALCIUM 8.4* 8.0* 7.8*  GFRNONAA 41* 46* 48*  GFRAA 48* 53* 56*  ANIONGAP _1 Recent Labs  Lab 08/17/18 2049  PROT 5.8*  ALBUMIN 2.7*  AST 20  ALT 20  ALKPHOS 45  BILITOT 0.5   Hematology Recent Labs  Lab 08/17/18 1332 08/18/18 0451 08/19/18 0425  WBC 4.7 3.2* 4.4  RBC 4.51 3.74* 3.96*  HGB 13.5 11.5* 11.9*  HCT 42.0 34.7* 36.1*  MCV 93.1 92.8 91.2  MCH 29.9 30.7 30.1  MCHC 32.1 33.1 33.0  RDW 12.6 12.9 12.9  PLT 121* 94* 107*   Cardiac EnzymesNo results for input(s): TROPONINI in the last 168 hours.  Recent Labs  Lab 08/17/18 1343  TROPIPOC 0.03    BNPNo results for input(s): BNP, PROBNP in the last 168 hours.  DDimer No results for input(s): DDIMER in the last 168 hours.  Radiology/Studies:  Dg Chest 1 View  Result Date: 08/18/2018 CLINICAL DATA:  Patchy density right upper lobe EXAM: CHEST  1 VIEW COMPARISON:  08/17/2018 FINDINGS: Apical lordotic image demonstrates persistent density in the right apex. This is concerning for apical nodule. Scarring in the lung bases. Heart is borderline in size. Mild  hyperinflation. No visible effusions. IMPRESSION: Apical lordotic image demonstrates persistent density in the right apex concerning for pulmonary nodule. Recommend chest CT for further evaluation. COPD. Electronically Signed   By: Rolm Baptise M.D.   On: 08/18/2018 09:59   Ct Chest Wo Contrast  Result Date: 08/18/2018 CLINICAL DATA:  Shortness of breath.  Follow up pulmonary nodule. EXAM: CT CHEST WITHOUT CONTRAST TECHNIQUE: Multidetector CT imaging of the chest was performed following the standard protocol without IV contrast. COMPARISON:  Chest radiograph August 18, 2018 FINDINGS: CARDIOVASCULAR: Heart size is normal. Minimal pericardial effusion. Thoracic aorta is normal course and caliber, unremarkable. MEDIASTINUM/NODES: No mediastinal mass. Mediastinal lymphadenopathy measuring to 16 mm short access. Suspected RIGHT and potentially LEFT hilar lymphadenopathy, decreased sensitivity without intravenous contrast. LUNGS/PLEURA: Tracheobronchial tree is patent, no pneumothorax. Mild bronchial wall thickening. Multiple prominent RIGHT-sided solid, sub solid and ground-glass nodules measuring to 8 mm RIGHT middle lobe (series 8, image 124). No RIGHT apical lung nodule, prominent first rib corresponding to known nodule on prior radiograph. Small RIGHT pleural effusion. RIGHT lower lobes medial scarring and bronchiectasis. UPPER ABDOMEN: Nonacute. MUSCULOSKELETAL: Nonacute. Broad dextroscoliosis. Old mild T8 compression fracture. IMPRESSION: 1. Mild bronchial wall thickening seen with bronchitis or reactive airway disease. Small RIGHT pleural effusion. No pneumonia. 2. Mild mediastinal lymphadenopathy may be reactive though, recommend close attention on follow-up CT. 3. **An incidental finding of potential clinical significance has been found. Multiple small pulmonary nodules. Non-contrast chest CT at 3-6 months is recommended. If the nodules are stable at time of repeat CT, then future CT at 18-24 months (from  today's scan) is considered optional for low-risk patients, but is recommended for high-risk patients. This recommendation follows the consensus statement: Guidelines for Management of Incidental Pulmonary Nodules Detected on CT Images: From the Fleischner Society 2017; Radiology 2017; 284:228-243.** Electronically Signed   By: Elon Alas M.D.   On: 08/18/2018 21:15   Dg Chest Port 1 View  Result Date: 08/17/2018 CLINICAL DATA:  Chills and shortness of breath for the past 3 days. Remote history of smoking. EXAM: PORTABLE CHEST 1 VIEW COMPARISON:  None in PACs FINDINGS: The lungs are well-expanded. The interstitial markings are coarse. There is patchy density in the right upper lobe that may be related to the costochondral junction of the first rib. The heart and pulmonary vascularity are normal. There is no pleural effusion. IMPRESSION: COPD. Patchy density in the right upper lobe may reflect the first costochondral junction but a parenchymal infiltrate or mass is not excluded. At minimum an apical lordotic chest x-ray is recommended. Chest CT scanning may be ultimately needed to exclude occult malignancy. Electronically Signed   By: David  Martinique M.D.   On: 08/17/2018 13:50    Assessment and Plan:   1. PAF with RVR he has been in  and out.  On IV dilt and one dose of IV Dig given due to hypotension.  He does need anticoagulation. Per Dr. Harrington Challenger, with improvement on plts. No awareness of a fib or tachycardia. Does have conversion pauses as well at least 3 sec.   , stop dilt, lopressor 25 mg po every 6 hours.  Dr. Harrington Challenger has seen 2. Sepsis, treated and improving.  Blood cultures pending 3. Hypotension improved.       For questions or updates, please contact [place] Please consult [email or web address] for contact info under     Signed, Cecilie Kicks, NP  08/19/2018 4:30 PM   Patient seen and examined  I agree with findings as noted by L IN[place] above Pt with no p[rior cardiac histroy   Seen  by PMD for fever   Found to be in afib with RVR    Pt also with strep infection   Admitted for ABX treatment In hospital he has been in Afib    Currently on Diltiazem 15 mg /hour   Will be in and out of afib   When converts has post termination pauses   Up to 4 seconds  ON exam HR curr controlled   He is in SR    Skin:   Diffuse erythematous rash    Neck:   JVP normal Lungs CTA    Cardiac RRR   No murmurs   Ext with no edema   2+ pulses  Labs:   POC trop normal   TSH normal Echo   LVEF normal   NO signif valve abnormalities EKG   SR  pR interval normal   1  PAF   Needs to be on anticoagulation   This may be a coexistent problem   I cannot blame on adrenergic drive of acute illness      Would switch to PO metoprolol   Follow    If continues to have post term pauses may need to consider antiarrhytjmic like flecanide to keep on SR      Recheck troponin  2   ID   Strep infection   / Scarlett fever       ON ABX   Check ESR      3  HTN   FOllow on meds

## 2018-08-19 NOTE — Progress Notes (Signed)
PROGRESS NOTE  Bernard Slayden Requejo RKY:[id number] DOB: 06-Nov-1945 DOA: 08/17/2018 PCP: Leanna Battles, MD  HPI/Recap of past 24 hours: Ricky Singh is an 72 y.o. male PMH of HTN, stage III chronic kidney disease, chronic low back pain, glaucoma, presented to [facility] ED via PCPs office for complaints of fever and chills, diagnosed with strep throat at PCPs office and noted to have asymptomatic A. fib with RVR on EKG done in the office. Patient and spouse at bedside provided history. He was in usual state of health until 11/16 when he started experiencing chills and feverishness. He did not check his temperature. Apart from chronic intermittent stuffiness of his ears, he denied any other complaints. He denied headache, earache, sore throat, difficulty swallowing, runny nose, watery eyes, cough, dyspnea, chest pain, palpitations, dizziness, lightheadedness, nausea, vomiting, diarrhea or urinary symptoms. No known exposure to sick contacts. Denied noticing skin rash at home. Received flu shot in September of this year and pneumonia shot couple years ago. He took Tylenol and his fevers would break with sweats only to return. Since he was not improving, he decided to go to PCPs office where throat swab tested positive for strep throat but randomly performed EKG showed A. fib with RVR. Patient was then instructed to come to the ED for further evaluation. Patient states that it is not unusual for him to have a strep throat without pain or other symptoms. He clearly indicated that rash was noted in the ED prior to administration of several antibiotics.  08/19/18: seen and examined with his wife at his bedside. Denies chest pain or palpitations. Has blanchable rash on his back and upper extremities. Could be related to his strep throat.  Assessment/Plan: Principal Problem:   Sepsis ([place]) Active Problems:   Strep pharyngitis with scarlet fever   Atrial fibrillation with RVR (HCC)   CKD (chronic kidney  disease), stage III (HCC)   Fever  Fever possibly multifactorial from recent strep vs chronic bronchitis others Tmax 102.3 on presentation Blood cx negative to date States always has a cough On rocephin Obtain procalcitonin Last fever was yesterday morning Continue to monitor fever curve, wbc Repeat CBC am  New Afib with RvR  CHADSvASC score 2 Cardiology consulted 2D echo revealed normal LVEF, No wall motion abnormalities  Recent Group A strep infection -treated  Pulmonary nodules Surveillance in 3 months.  Patient made aware  CKD3 Appears to be at his baseline Avoid nephrotoxic agents Monitor u/o Repeat BMP am  Chronic normocytic anemia No sign of overt bleeding Hg stable  Alcohol use disorder  CIWA protocol in place  Chronic thrombocytopenia plt 107 from 94k No sign of mucosal bleeding Continue to monitor May be related to his chronic alcohol use  Code Status: Full code  Family Communication: Wife at bedside  Disposition Plan: Home possibly 1 to 2 days or when cardiology signs off.   Consultants:  Cardiology  Procedures:  None   Antimicrobials:  None  DVT prophylaxis:  SCDs   Objective: Vitals:   08/19/18 1000 08/19/18 1100 08/19/18 1126 08/19/18 1200  BP:  97/63 97/63 105/67  Pulse:   87   Resp: 17 (!) 21 (!) 23 (!) 21  Temp:   98.9 F (37.2 C)   TempSrc:   Oral   SpO2:   98%   Weight:      Height:        Intake/Output Summary (Last 24 hours) at 08/19/2018 1309 Last data filed at 08/19/2018 1200 Gross per  24 hour  Intake 1030 ml  Output 1550 ml  Net -520 ml   Filed Weights   08/17/18 1323 08/17/18 1920 08/19/18 0654  Weight: 99.8 kg 103.4 kg 101.9 kg    Exam:  . General: 72 y.o. year-old male well developed well nourished in no acute distress.  Alert and oriented x3. . Cardiovascular: Irregular rate and rhythm with no rubs or gallops.  No thyromegaly or JVD noted.   Marland Kitchen Respiratory: Clear to auscultation with no wheezes  or rales. Good inspiratory effort. . Abdomen: Soft nontender nondistended with normal bowel sounds x4 quadrants. . Musculoskeletal: No lower extremity edema. 2/4 pulses in all 4 extremities. . Skin: Blanchable rash at torso and upper ext b/l . Psychiatry: Mood is appropriate for condition and setting   Data Reviewed: CBC: Recent Labs  Lab 08/17/18 1332 08/18/18 0451 08/19/18 0425  WBC 4.7 3.2* 4.4  NEUTROABS 3.9  --   --   HGB 13.5 11.5* 11.9*  HCT 42.0 34.7* 36.1*  MCV 93.1 92.8 91.2  PLT 121* 94* 371*   Basic Metabolic Panel: Recent Labs  Lab 08/17/18 1332 08/18/18 0451 08/19/18 0425  NA 136 140 135  K 4.2 4.1 3.7  CL 110 113* 108  CO2 18* 20* 18*  GLUCOSE 123* 111* 103*  BUN 21 22 17   CREATININE 1.59* 1.46* 1.40*  CALCIUM 8.4* 8.0* 7.8*   GFR: Estimated Creatinine Clearance: 60.8 mL/min (A) (by C-G formula based on SCr of 1.4 mg/dL (H)). Liver Function Tests: Recent Labs  Lab 08/17/18 2049  AST 20  ALT 20  ALKPHOS 45  BILITOT 0.5  PROT 5.8*  ALBUMIN 2.7*   No results for input(s): LIPASE, AMYLASE in the last 168 hours. No results for input(s): AMMONIA in the last 168 hours. Coagulation Profile: No results for input(s): INR, PROTIME in the last 168 hours. Cardiac Enzymes: No results for input(s): CKTOTAL, CKMB, CKMBINDEX, TROPONINI in the last 168 hours. BNP (last 3 results) No results for input(s): PROBNP in the last 8760 hours. HbA1C: No results for input(s): HGBA1C in the last 72 hours. CBG: No results for input(s): GLUCAP in the last 168 hours. Lipid Profile: No results for input(s): CHOL, HDL, LDLCALC, TRIG, CHOLHDL, LDLDIRECT in the last 72 hours. Thyroid Function Tests: Recent Labs    08/17/18 2049  TSH 1.412   Anemia Panel: No results for input(s): VITAMINB12, FOLATE, FERRITIN, TIBC, IRON, RETICCTPCT in the last 72 hours. Urine analysis:    Component Value Date/Time   COLORURINE YELLOW 08/17/2018 [place] 08/17/2018  1608   LABSPEC 1.015 08/17/2018 1608   PHURINE 5.0 08/17/2018 1608   GLUCOSEU NEGATIVE 08/17/2018 1608   HGBUR NEGATIVE 08/17/2018 1608   BILIRUBINUR NEGATIVE 08/17/2018 1608   KETONESUR NEGATIVE 08/17/2018 1608   PROTEINUR NEGATIVE 08/17/2018 1608   NITRITE NEGATIVE 08/17/2018 1608   LEUKOCYTESUR NEGATIVE 08/17/2018 1608   Sepsis Labs: @LABRCNTIP (procalcitonin:4,lacticidven:4)  ) Recent Results (from the past 240 hour(s))  Blood culture (routine x 2)     Status: None (Preliminary result)   Collection Time: 08/17/18  1:45 PM  Result Value Ref Range Status   Specimen Description BLOOD RIGHT ARM  Final   Special Requests   Final    BOTTLES DRAWN AEROBIC AND ANAEROBIC Blood Culture adequate volume   Culture   Final    NO GROWTH 2 DAYS Performed at [facility], [place] [address], [place], [place] [address]    Report Status PENDING  Incomplete  Urine  culture     Status: None   Collection Time: 08/17/18  4:08 PM  Result Value Ref Range Status   Specimen Description URINE, CLEAN CATCH  Final   Special Requests NONE  Final   Culture   Final    NO GROWTH Performed at [facility], [address], [place], [place] [address]    Report Status 08/18/2018 FINAL  Final  Blood culture (routine x 2)     Status: None (Preliminary result)   Collection Time: 08/17/18  4:30 PM  Result Value Ref Range Status   Specimen Description SITE NOT SPECIFIED  Final   Special Requests   Final    BOTTLES DRAWN AEROBIC AND ANAEROBIC Blood Culture adequate volume   Culture   Final    NO GROWTH 2 DAYS Performed at [facility], [address], [place], [place] [address]    Report Status PENDING  Incomplete  Culture, blood (Routine X 2) w Reflex to ID Panel     Status: None (Preliminary result)   Collection Time: 08/18/18  4:50 PM  Result Value Ref Range Status   Specimen Description BLOOD LEFT HAND  Final   Special Requests   Final    BOTTLES DRAWN AEROBIC ONLY Blood Culture adequate  volume   Culture   Final    NO GROWTH < 24 HOURS Performed at [facility], [place] [address], [place], [place] [address]    Report Status PENDING  Incomplete  Respiratory Panel by PCR     Status: None   Collection Time: 08/18/18  7:03 PM  Result Value Ref Range Status   Adenovirus NOT DETECTED NOT DETECTED Final   Coronavirus 229E NOT DETECTED NOT DETECTED Final   Coronavirus HKU1 NOT DETECTED NOT DETECTED Final   Coronavirus NL63 NOT DETECTED NOT DETECTED Final   Coronavirus OC43 NOT DETECTED NOT DETECTED Final   Metapneumovirus NOT DETECTED NOT DETECTED Final   Rhinovirus / Enterovirus NOT DETECTED NOT DETECTED Final   Influenza A NOT DETECTED NOT DETECTED Final   Influenza B NOT DETECTED NOT DETECTED Final   Parainfluenza Virus 1 NOT DETECTED NOT DETECTED Final   Parainfluenza Virus 2 NOT DETECTED NOT DETECTED Final   Parainfluenza Virus 3 NOT DETECTED NOT DETECTED Final   Parainfluenza Virus 4 NOT DETECTED NOT DETECTED Final   Respiratory Syncytial Virus NOT DETECTED NOT DETECTED Final   Bordetella pertussis NOT DETECTED NOT DETECTED Final   Chlamydophila pneumoniae NOT DETECTED NOT DETECTED Final   Mycoplasma pneumoniae NOT DETECTED NOT DETECTED Final    Comment: Performed at [facility], [place] [address] [place], [place] [address]      Studies: Ct Chest Wo Contrast  Result Date: 08/18/2018 CLINICAL DATA:  Shortness of breath.  Follow up pulmonary nodule. EXAM: CT CHEST WITHOUT CONTRAST TECHNIQUE: Multidetector CT imaging of the chest was performed following the standard protocol without IV contrast. COMPARISON:  Chest radiograph August 18, 2018 FINDINGS: CARDIOVASCULAR: Heart size is normal. Minimal pericardial effusion. Thoracic aorta is normal course and caliber, unremarkable. MEDIASTINUM/NODES: No mediastinal mass. Mediastinal lymphadenopathy measuring to 16 mm short access. Suspected RIGHT and potentially LEFT hilar lymphadenopathy, decreased sensitivity  without intravenous contrast. LUNGS/PLEURA: Tracheobronchial tree is patent, no pneumothorax. Mild bronchial wall thickening. Multiple prominent RIGHT-sided solid, sub solid and ground-glass nodules measuring to 8 mm RIGHT middle lobe (series 8, image 124). No RIGHT apical lung nodule, prominent first rib corresponding to known nodule on prior radiograph. Small RIGHT pleural effusion.  RIGHT lower lobes medial scarring and bronchiectasis. UPPER ABDOMEN: Nonacute. MUSCULOSKELETAL: Nonacute. Broad dextroscoliosis. Old mild T8 compression fracture. IMPRESSION: 1. Mild bronchial wall thickening seen with bronchitis or reactive airway disease. Small RIGHT pleural effusion. No pneumonia. 2. Mild mediastinal lymphadenopathy may be reactive though, recommend close attention on follow-up CT. 3. **An incidental finding of potential clinical significance has been found. Multiple small pulmonary nodules. Non-contrast chest CT at 3-6 months is recommended. If the nodules are stable at time of repeat CT, then future CT at 18-24 months (from today's scan) is considered optional for low-risk patients, but is recommended for high-risk patients. This recommendation follows the consensus statement: Guidelines for Management of Incidental Pulmonary Nodules Detected on CT Images: From the Fleischner Society 2017; Radiology 2017; 284:228-243.** Electronically Signed   By: Elon Alas M.D.   On: 08/18/2018 21:15    Scheduled Meds: . aspirin EC  81 mg Oral Daily  . brimonidine  1 drop Right Eye BID  . digoxin  0.5 mg Intravenous Once  . folic acid  1 mg Oral Daily  . gabapentin  300 mg Oral BID  . latanoprost  1 drop Both Eyes QHS  . multivitamin  1 tablet Oral Daily  . multivitamin with minerals  1 tablet Oral Daily  . sodium chloride flush  3 mL Intravenous Q12H  . thiamine  100 mg Oral Daily   Or  . thiamine  100 mg Intravenous Daily    Continuous Infusions: . cefTRIAXone (ROCEPHIN)  IV 2 g (08/18/18 1705)  .  diltiazem (CARDIZEM) infusion 15 mg/hr (08/19/18 1130)     LOS: 2 days     Kayleen Memos, MD [facility] Pager [phone]  If 7PM-7AM, please contact night-coverage [email or web address] Password [email or web address] 08/19/2018, 1:09 PM

## 2018-08-19 NOTE — Progress Notes (Signed)
ANTICOAGULATION CONSULT NOTE - Initial Consult  Pharmacy Consult for xarelto Indication: atrial fibrillation  No Known Allergies  Patient Measurements: Height: 6\' 3"  (190.5 cm) Weight: 224 lb 11.2 oz (101.9 kg) IBW/kg (Calculated) : 84.5  Vital Signs: Temp: 99.7 F (37.6 C) (11/21 1638) Temp Source: Oral (11/21 1638) BP: 109/96 (11/21 1638) Pulse Rate: 78 (11/21 1638)  Labs: Recent Labs    08/17/18 1332 08/18/18 0451 08/19/18 0425  HGB 13.5 11.5* 11.9*  HCT 42.0 34.7* 36.1*  PLT 121* 94* 107*  CREATININE 1.59* 1.46* 1.40*    Estimated Creatinine Clearance: 60.8 mL/min (A) (by C-G formula based on SCr of 1.4 mg/dL (H)).   Medical History: Past Medical History:  Diagnosis Date  . Allergy   . Ankle fracture, left   . Arthritis   . Cataract   . Chronic kidney disease (CKD), stage III (moderate) (HCC)   . Femur fracture, right ([place])   . Glaucoma   . Hypertension   . Macular degeneration   . Scoliosis    Assessment: 72 year old male with new afib with RVR. New orders received to start xarelto. Crcl ~60 so no dose adjustments warranted. Hgb stable in 11s, pltc low at baseline ~100s, will continue to follow closely.   Goal of Therapy:  Monitor platelets by anticoagulation protocol: Yes   Plan:  Xarelto 20mg  daily  Erin Hearing PharmD., BCPS Clinical Pharmacist 08/19/2018 5:49 PM

## 2018-08-20 DIAGNOSIS — I4891 Unspecified atrial fibrillation: Secondary | ICD-10-CM

## 2018-08-20 DIAGNOSIS — I129 Hypertensive chronic kidney disease with stage 1 through stage 4 chronic kidney disease, or unspecified chronic kidney disease: Secondary | ICD-10-CM

## 2018-08-20 DIAGNOSIS — H409 Unspecified glaucoma: Secondary | ICD-10-CM

## 2018-08-20 DIAGNOSIS — R918 Other nonspecific abnormal finding of lung field: Secondary | ICD-10-CM

## 2018-08-20 DIAGNOSIS — N183 Chronic kidney disease, stage 3 (moderate): Secondary | ICD-10-CM

## 2018-08-20 DIAGNOSIS — Z87891 Personal history of nicotine dependence: Secondary | ICD-10-CM

## 2018-08-20 DIAGNOSIS — Z22338 Carrier of other streptococcus: Secondary | ICD-10-CM

## 2018-08-20 DIAGNOSIS — J90 Pleural effusion, not elsewhere classified: Secondary | ICD-10-CM

## 2018-08-20 DIAGNOSIS — J449 Chronic obstructive pulmonary disease, unspecified: Secondary | ICD-10-CM

## 2018-08-20 LAB — PROCALCITONIN: PROCALCITONIN: 0.24 ng/mL

## 2018-08-20 LAB — BASIC METABOLIC PANEL
Anion gap: 7 (ref 5–15)
BUN: 19 mg/dL (ref 8–23)
CHLORIDE: 106 mmol/L (ref 98–111)
CO2: 21 mmol/L — AB (ref 22–32)
Calcium: 7.9 mg/dL — ABNORMAL LOW (ref 8.9–10.3)
Creatinine, Ser: 1.46 mg/dL — ABNORMAL HIGH (ref 0.61–1.24)
GFR calc non Af Amer: 46 mL/min — ABNORMAL LOW (ref >60)
GFR, EST AFRICAN AMERICAN: 53 mL/min — AB (ref >60)
Glucose, Bld: 111 mg/dL — ABNORMAL HIGH (ref 70–99)
POTASSIUM: 3.9 mmol/L (ref 3.5–5.1)
Sodium: 134 mmol/L — ABNORMAL LOW (ref 135–145)

## 2018-08-20 LAB — CBC
HEMATOCRIT: 35.1 % — AB (ref 39.0–52.0)
Hemoglobin: 11.9 g/dL — ABNORMAL LOW (ref 13.0–17.0)
MCH: 30.5 pg (ref 26.0–34.0)
MCHC: 33.9 g/dL (ref 30.0–36.0)
MCV: 90 fL (ref 80.0–100.0)
Platelets: 116 10*3/uL — ABNORMAL LOW (ref 150–400)
RBC: 3.9 MIL/uL — AB (ref 4.22–5.81)
RDW: 13 % (ref 11.5–15.5)
WBC: 4.7 10*3/uL (ref 4.0–10.5)
nRBC: 0 % (ref 0.0–0.2)

## 2018-08-20 LAB — LACTIC ACID, PLASMA: LACTIC ACID, VENOUS: 0.8 mmol/L (ref 0.5–1.9)

## 2018-08-20 MED ORDER — METOPROLOL TARTRATE 25 MG PO TABS
25.0000 mg | ORAL_TABLET | Freq: Once | ORAL | Status: AC
  Administered 2018-08-20: 25 mg via ORAL
  Filled 2018-08-20: qty 1

## 2018-08-20 MED ORDER — FLECAINIDE ACETATE 50 MG PO TABS
75.0000 mg | ORAL_TABLET | Freq: Two times a day (BID) | ORAL | Status: DC
  Administered 2018-08-20 – 2018-08-22 (×5): 75 mg via ORAL
  Filled 2018-08-20 (×5): qty 2

## 2018-08-20 MED ORDER — METOPROLOL TARTRATE 50 MG PO TABS
50.0000 mg | ORAL_TABLET | Freq: Two times a day (BID) | ORAL | Status: DC
  Administered 2018-08-20 – 2018-08-22 (×4): 50 mg via ORAL
  Filled 2018-08-20 (×4): qty 1

## 2018-08-20 NOTE — Progress Notes (Addendum)
Progress Note  Patient Name: Ricky Singh Date of Encounter: 08/20/2018  Primary Cardiologist: Dorris Carnes, MD   Subjective   Still with some intermittent fevers overnight/this AM. No complaints of CP, SOB, or palpitations. Feels pretty good other than ongoing fevers.   Inpatient Medications    Scheduled Meds: . brimonidine  1 drop Right Eye BID  . digoxin  0.5 mg Intravenous Once  . folic acid  1 mg Oral Daily  . gabapentin  300 mg Oral BID  . latanoprost  1 drop Both Eyes QHS  . metoprolol tartrate  25 mg Oral Q6H  . multivitamin  1 tablet Oral Daily  . multivitamin with minerals  1 tablet Oral Daily  . rivaroxaban  20 mg Oral Q supper  . sodium chloride flush  3 mL Intravenous Q12H  . thiamine  100 mg Oral Daily   Or  . thiamine  100 mg Intravenous Daily   Continuous Infusions: . cefTRIAXone (ROCEPHIN)  IV 2 g (08/19/18 1549)   PRN Meds: acetaminophen **OR** acetaminophen, albuterol, LORazepam **OR** LORazepam   Vital Signs    Vitals:   08/19/18 2338 08/20/18 0500 08/20/18 0637 08/20/18 0814  BP:  128/74    Pulse: (!) 108 84 85   Resp:  15    Temp:  99.9 F (37.7 C)  99.3 F (37.4 C)  TempSrc:  Oral  Axillary  SpO2:  98%    Weight:      Height:        Intake/Output Summary (Last 24 hours) at 08/20/2018 0830 Last data filed at 08/20/2018 0700 Gross per 24 hour  Intake 1490 ml  Output 3150 ml  Net -1660 ml   Filed Weights   08/17/18 1323 08/17/18 1920 08/19/18 0654  Weight: 99.8 kg 103.4 kg 101.9 kg    Telemetry    Maintaining sinus rhythm with rate generally in the 80s - Personally Reviewed  Physical Exam   GEN: Sitting upright in bed in no acute distress. Sweating.  Neck: No JVD, no carotid bruits Cardiac: RRR, no murmurs, rubs, or gallops.  Respiratory: Clear to auscultation bilaterally, no wheezes/ rales/ rhonchi GI: NABS, Soft, nontender, non-distended  MS: No edema; No deformity. Neuro:  Nonfocal, moving all extremities  spontaneously Psych: Normal affect   Labs    Chemistry Recent Labs  Lab 08/17/18 2049 08/18/18 0451 08/19/18 0425 08/20/18 0426  NA  --  140 135 134*  K  --  4.1 3.7 3.9  CL  --  113* 108 106  CO2  --  20* 18* 21*  GLUCOSE  --  111* 103* 111*  BUN  --  '22 17 19  ' CREATININE  --  1.46* 1.40* 1.46*  CALCIUM  --  8.0* 7.8* 7.9*  PROT 5.8*  --   --   --   ALBUMIN 2.7*  --   --   --   AST 20  --   --   --   ALT 20  --   --   --   ALKPHOS 45  --   --   --   BILITOT 0.5  --   --   --   GFRNONAA  --  46* 48* 46*  GFRAA  --  53* 56* 53*  ANIONGAP  --  '7 9 7     ' Hematology Recent Labs  Lab 08/18/18 0451 08/19/18 0425 08/20/18 0426  WBC 3.2* 4.4 4.7  RBC 3.74* 3.96* 3.90*  HGB 11.5* 11.9* 11.9*  HCT 34.7* 36.1* 35.1*  MCV 92.8 91.2 90.0  MCH 30.7 30.1 30.5  MCHC 33.1 33.0 33.9  RDW 12.9 12.9 13.0  PLT 94* 107* 116*    Cardiac Enzymes Recent Labs  Lab 08/19/18 1822  TROPONINI <0.03    Recent Labs  Lab 08/17/18 1343  TROPIPOC 0.03     BNPNo results for input(s): BNP, PROBNP in the last 168 hours.   DDimer No results for input(s): DDIMER in the last 168 hours.   Radiology    Dg Chest 1 View  Result Date: 08/18/2018 CLINICAL DATA:  Patchy density right upper lobe EXAM: CHEST  1 VIEW COMPARISON:  08/17/2018 FINDINGS: Apical lordotic image demonstrates persistent density in the right apex. This is concerning for apical nodule. Scarring in the lung bases. Heart is borderline in size. Mild hyperinflation. No visible effusions. IMPRESSION: Apical lordotic image demonstrates persistent density in the right apex concerning for pulmonary nodule. Recommend chest CT for further evaluation. COPD. Electronically Signed   By: Rolm Baptise M.D.   On: 08/18/2018 09:59   Ct Chest Wo Contrast  Result Date: 08/18/2018 CLINICAL DATA:  Shortness of breath.  Follow up pulmonary nodule. EXAM: CT CHEST WITHOUT CONTRAST TECHNIQUE: Multidetector CT imaging of the chest was performed  following the standard protocol without IV contrast. COMPARISON:  Chest radiograph August 18, 2018 FINDINGS: CARDIOVASCULAR: Heart size is normal. Minimal pericardial effusion. Thoracic aorta is normal course and caliber, unremarkable. MEDIASTINUM/NODES: No mediastinal mass. Mediastinal lymphadenopathy measuring to 16 mm short access. Suspected RIGHT and potentially LEFT hilar lymphadenopathy, decreased sensitivity without intravenous contrast. LUNGS/PLEURA: Tracheobronchial tree is patent, no pneumothorax. Mild bronchial wall thickening. Multiple prominent RIGHT-sided solid, sub solid and ground-glass nodules measuring to 8 mm RIGHT middle lobe (series 8, image 124). No RIGHT apical lung nodule, prominent first rib corresponding to known nodule on prior radiograph. Small RIGHT pleural effusion. RIGHT lower lobes medial scarring and bronchiectasis. UPPER ABDOMEN: Nonacute. MUSCULOSKELETAL: Nonacute. Broad dextroscoliosis. Old mild T8 compression fracture. IMPRESSION: 1. Mild bronchial wall thickening seen with bronchitis or reactive airway disease. Small RIGHT pleural effusion. No pneumonia. 2. Mild mediastinal lymphadenopathy may be reactive though, recommend close attention on follow-up CT. 3. **An incidental finding of potential clinical significance has been found. Multiple small pulmonary nodules. Non-contrast chest CT at 3-6 months is recommended. If the nodules are stable at time of repeat CT, then future CT at 18-24 months (from today's scan) is considered optional for low-risk patients, but is recommended for high-risk patients. This recommendation follows the consensus statement: Guidelines for Management of Incidental Pulmonary Nodules Detected on CT Images: From the Fleischner Society 2017; Radiology 2017; 284:228-243.** Electronically Signed   By: Elon Alas M.D.   On: 08/18/2018 21:15    Cardiac Studies   Echocardiogram 08/18/18: Study Conclusions  - Left ventricle: The cavity size  was normal. Systolic function was   normal. The estimated ejection fraction was in the range of 60%   to 65%. Wall motion was normal; there were no regional wall   motion abnormalities. Left ventricular diastolic function   parameters were normal. - Aortic valve: Valve area (VTI): 2.72 cm^2. Valve area (Vmean):   2.72 cm^2. - Left atrium: The atrium was mildly dilated. - Atrial septum: No defect or patent foramen ovale was identified.   Patient Profile     72 y.o. male with a PMH of HTN, CKD-3, macular degeneration who is being seen today for the evaluation of A fib with RVR with an admit for  fever and chills, + strep throat and was found to be in a fib with RVR   Assessment & Plan    1. New onset paroxysmal atrial fibrillation: He was started on apixaban for CHA2DS2-VASc Score of 2 (HTN, Age 70-74). He was started on a diltiazem gtt with conversion to NSR. Maintaining sinus rhythm on metoprolol tartrate 26m q6hr. - Will consolidate metoprolol doses to 5226mBID for continued rate control - Continue apixaban 26m53mID for stroke ppx [place] below    2. Sepsis in the setting of strep throat: on IV antibiotics and supportive care. Still with fevers overnight.  - Continue management per primary team  3. HTN: has been hypotensive in the setting of sepsis and afib RVR. Home losartan and doxazosin held on admission. BP stable overnight/this AM.  - Continue metoprolol - will consolidate doses to 32m44mD - Can consider restarting doxazosin - would defer to outpatient setting as BP stable on metoprolol  - Would continue to hold losartan given elevated Cr (unclear baseline)       For questions or updates, please contact [place] consult [email or web address] for contact info under Cardiology/STEMI.      Signed, KrisAbigail Butts-C  08/20/2018, 8:30 AM   [phone] seen and examined   He is comfortable   Lungs CTA Cardia  irreg Irreg   No S3  No murmurs Ext without  edeam Skin  Erythematous rash persists  Trop negative   ESR normal    CT with no obvious coronary artery calcifications    Pt has reverted to afib with RVR   110s to 120s   Comfortable   Was in and out of afib some last night   No post termination pauses like yesterday    I would recomm adding flecanide 75 mg bid    QRS is OK   Will need EKG in 1 wk  To reassess  Continue metoprolol Continue Xarelto  Would recomm on d/c that he follow up in afib clinic for more streamlined care     PaulDorris Carnes

## 2018-08-20 NOTE — Care Management (Signed)
#    5.   S/W  DEVON   @   [facility]  RX #  [phone]   XARELTO  20 MG DAILY COVER- YES CO-PAY-  $ 25.00 TIER- 2 DRUG PRIOR APPROVAL-NO  PREFERRED PHARMACY : YES [facility] AND [facility] M/O 90 DAY SUPPLY FOR M/O   OR   RETAIL $ 25.00

## 2018-08-20 NOTE — Care Management Note (Signed)
Case Management Note  Patient Details  Name: Ricky Singh MRN: [id number] Date of Birth: 1946/09/21  Subjective/Objective: Pt presented for Sepsis- fever, chills and found to be in Afib RVR-Plan for Xarelto. Patient is aware of co pay. Xarelto 30 day free card provided to patient. PTA from home with support of wife.                    Action/Plan: No home needs identified at this time. CM will continue to monitor.   Expected Discharge Date:  08/16/18               Expected Discharge Plan:  Home/Self Care  In-House Referral:  NA  Discharge planning Services  CM Consult  Post Acute Care Choice:  NA Choice offered to:  NA  DME Arranged:  N/A DME Agency:  NA  HH Arranged:  NA HH Agency:  NA  Status of Service:  Completed, signed off  If discussed at [place] of Stay Meetings, dates discussed:    Additional Comments:  Bethena Roys, RN 08/20/2018, 3:48 PM

## 2018-08-20 NOTE — Progress Notes (Addendum)
Pt is back in SR in 70s.  Idolina Primer, RN

## 2018-08-20 NOTE — Discharge Instructions (Addendum)
Atrial Fibrillation Atrial fibrillation is a type of heartbeat that is irregular or fast (rapid). If you have this condition, your heart keeps quivering in a weird (chaotic) way. This condition can make it so your heart cannot pump blood normally. Having this condition gives a person more risk for stroke, heart failure, and other heart problems. There are different types of atrial fibrillation. Talk with your doctor to learn about the type that you have. Follow these instructions at home:  Take over-the-counter and prescription medicines only as told by your doctor.  If your doctor prescribed a blood-thinning medicine, take it exactly as told. Taking too much of it can cause bleeding. If you do not take enough of it, you will not have the protection that you need against stroke and other problems.  Do not use any tobacco products. These include cigarettes, chewing tobacco, and e-cigarettes. If you need help quitting, ask your doctor.  If you have apnea (obstructive sleep apnea), manage it as told by your doctor.  Do not drink alcohol.  Do not drink beverages that have caffeine. These include coffee, soda, and tea.  Maintain a healthy weight. Do not use diet pills unless your doctor says they are safe for you. Diet pills may make heart problems worse.  Follow diet instructions as told by your doctor.  Exercise regularly as told by your doctor.  Keep all follow-up visits as told by your doctor. This is important. Contact a doctor if:  You notice a change in the speed, rhythm, or strength of your heartbeat.  You are taking a blood-thinning medicine and you notice more bruising.  You get tired more easily when you move or exercise. Get help right away if:  You have pain in your chest or your belly (abdomen).  You have sweating or weakness.  You feel sick to your stomach (nauseous).  You notice blood in your throw up (vomit), poop (stool), or pee (urine).  You are short of  breath.  You suddenly have swollen feet and ankles.  You feel dizzy.  Your suddenly get weak or numb in your face, arms, or legs, especially if it happens on one side of your body.  You have trouble talking, trouble understanding, or both.  Your face or your eyelid droops on one side. These symptoms may be an emergency. Do not wait to [place] if the symptoms will go away. Get medical help right away. Call your local emergency services (911 in the [place] Do not drive yourself to the hospital. This information is not intended to replace advice given to you by your health care provider. Make sure you discuss any questions you have with your health care provider. Document Released: 06/24/2008 Document Revised: 02/21/2016 Document Reviewed: 01/10/2015 Elsevier Interactive Patient Education  2018 [place] on my medicine - XARELTO (Rivaroxaban)  This medication education was reviewed with me or my healthcare representative as part of my discharge preparation.  The pharmacist that spoke with me during my hospital stay was:  Sindy Guadeloupe, [facility]  Why was Xarelto prescribed for you? Xarelto was prescribed for you to reduce the risk of a blood clot forming that can cause a stroke if you have a medical condition called atrial fibrillation (a type of irregular heartbeat).  What do you need to know about xarelto ? Take your Xarelto ONCE DAILY at the same time every day with your evening meal. If you have difficulty swallowing the tablet whole, you may crush it and  mix in applesauce just prior to taking your dose.  Take Xarelto exactly as prescribed by your doctor and DO NOT stop taking Xarelto without talking to the doctor who prescribed the medication.  Stopping without other stroke prevention medication to take the place of Xarelto may increase your risk of developing a clot that causes a stroke.  Refill your prescription before you run out.  After discharge, you should  have regular check-up appointments with your healthcare provider that is prescribing your Xarelto.  In the future your dose may need to be changed if your kidney function or weight changes by a significant amount.  What do you do if you miss a dose? If you are taking Xarelto ONCE DAILY and you miss a dose, take it as soon as you remember on the same day then continue your regularly scheduled once daily regimen the next day. Do not take two doses of Xarelto at the same time or on the same day.   Important Safety Information A possible side effect of Xarelto is bleeding. You should call your healthcare provider right away if you experience any of the following: ? Bleeding from an injury or your nose that does not stop. ? Unusual colored urine (red or dark brown) or unusual colored stools (red or black). ? Unusual bruising for unknown reasons. ? A serious fall or if you hit your head (even if there is no bleeding).  Some medicines may interact with Xarelto and might increase your risk of bleeding while on Xarelto. To help avoid this, consult your healthcare provider or pharmacist prior to using any new prescription or non-prescription medications, including herbals, vitamins, non-steroidal anti-inflammatory drugs (NSAIDs) and supplements.  This website has more information on Xarelto: [email or web address].[email or web address]    Heart-Healthy Eating Plan Heart-healthy meal planning includes:  Limiting unhealthy fats.  Increasing healthy fats.  Making other small dietary changes.  You may need to talk with your doctor or a diet specialist (dietitian) to create an eating plan that is right for you. What types of fat should I choose?  Choose healthy fats. These include olive oil and canola oil, flaxseeds, walnuts, almonds, and seeds.  Eat more omega-3 fats. These include salmon, mackerel, sardines, tuna, flaxseed oil, and ground flaxseeds. Try to eat fish at least twice each week.  Limit saturated  fats. ? Saturated fats are often found in animal products, such as meats, butter, and cream. ? Plant sources of saturated fats include palm oil, palm kernel oil, and coconut oil.  Avoid foods with partially hydrogenated oils in them. These include stick margarine, some tub margarines, cookies, crackers, and other baked goods. These contain trans fats. What general guidelines do I need to follow?  Check food labels carefully. Identify foods with trans fats or high amounts of saturated fat.  Fill one half of your plate with vegetables and green salads. Eat 4-5 servings of vegetables per day. A serving of vegetables is: ? 1 cup of raw leafy vegetables. ?  cup of raw or cooked cut-up vegetables. ?  cup of vegetable juice.  Fill one fourth of your plate with whole grains. Look for the word "whole" as the first word in the ingredient list.  Fill one fourth of your plate with lean protein foods.  Eat 4-5 servings of fruit per day. A serving of fruit is: ? One medium whole fruit. ?  cup of dried fruit. ?  cup of fresh, frozen, or canned fruit. ?  cup of 100% fruit juice.  Eat more foods that contain soluble fiber. These include apples, broccoli, carrots, beans, peas, and barley. Try to get 20-30 g of fiber per day.  Eat more home-cooked food. Eat less restaurant, buffet, and fast food.  Limit or avoid alcohol.  Limit foods high in starch and sugar.  Avoid fried foods.  Avoid frying your food. Try baking, boiling, grilling, or broiling it instead. You can also reduce fat by: ? Removing the skin from poultry. ? Removing all visible fats from meats. ? Skimming the fat off of stews, soups, and gravies before serving them. ? Steaming vegetables in water or broth.  Lose weight if you are overweight.  Eat 4-5 servings of nuts, legumes, and seeds per week: ? One serving of dried beans or legumes equals  cup after being cooked. ? One serving of nuts equals 1 ounces. ? One serving  of seeds equals  ounce or one tablespoon.  You may need to keep track of how much salt or sodium you eat. This is especially true if you have high blood pressure. Talk with your doctor or dietitian to get more information. What foods can I eat? Grains Breads, including [place], white, pita, wheat, raisin, rye, oatmeal, and [place]. Tortillas that are neither fried nor made with lard or trans fat. Low-fat rolls, including hotdog and hamburger buns and English muffins. Biscuits. Muffins. Waffles. Pancakes. Light popcorn. Whole-grain cereals. Flatbread. Melba toast. Pretzels. Breadsticks. Rusks. Low-fat snacks. Low-fat crackers, including oyster, saltine, matzo, graham, animal, and rye. Rice and pasta, including brown rice and pastas that are made with whole wheat. Vegetables All vegetables. Fruits All fruits, but limit coconut. Meats and Other Protein Sources Lean, well-trimmed beef, veal, pork, and lamb. Chicken and [place] without skin. All fish and shellfish. Wild duck, rabbit, pheasant, and venison. Egg whites or low-cholesterol egg substitutes. Dried beans, peas, lentils, and tofu. Seeds and most nuts. Dairy Low-fat or nonfat cheeses, including ricotta, string, and mozzarella. Skim or 1% milk that is liquid, powdered, or evaporated. Buttermilk that is made with low-fat milk. Nonfat or low-fat yogurt. Beverages Mineral water. Diet carbonated beverages. Sweets and Desserts Sherbets and fruit ices. Honey, jam, marmalade, jelly, and syrups. Meringues and gelatins. Pure sugar candy, such as hard candy, jelly beans, gumdrops, mints, marshmallows, and small amounts of dark chocolate. [employer] Eat all sweets and desserts in moderation. Fats and Oils Nonhydrogenated (trans-free) margarines. Vegetable oils, including soybean, sesame, sunflower, olive, peanut, safflower, corn, canola, and cottonseed. Salad dressings or mayonnaise made with a vegetable oil. Limit added fats and oils that you use  for cooking, baking, salads, and as spreads. Other Cocoa powder. Coffee and tea. All seasonings and condiments. The items listed above may not be a complete list of recommended foods or beverages. Contact your dietitian for more options. What foods are not recommended? Grains Breads that are made with saturated or trans fats, oils, or whole milk. Croissants. Butter rolls. Cheese breads. Sweet rolls. Donuts. Buttered popcorn. Chow mein noodles. High-fat crackers, such as cheese or butter crackers. Meats and Other Protein Sources Fatty meats, such as hotdogs, short ribs, sausage, spareribs, bacon, rib eye roast or steak, and mutton. High-fat deli meats, such as salami and bologna. Caviar. Domestic duck and goose. Organ meats, such as kidney, liver, sweetbreads, and heart. Dairy Cream, sour cream, cream cheese, and creamed cottage cheese. Whole-milk cheeses, including blue (bleu), Monte[place] Jack, [place], [place], American, [place], Swiss, cheddar, [place], and [place]. Whole or 2% milk that is liquid, evaporated, or condensed.  Whole buttermilk. Cream sauce or high-fat cheese sauce. Yogurt that is made from whole milk. Beverages Regular sodas and juice drinks with added sugar. Sweets and Desserts Frosting. Pudding. Cookies. Cakes other than angel food cake. Candy that has milk chocolate or white chocolate, hydrogenated fat, butter, coconut, or unknown ingredients. Buttered syrups. Full-fat ice cream or ice cream drinks. Fats and Oils Gravy that has suet, meat fat, or shortening. Cocoa butter, hydrogenated oils, palm oil, coconut oil, palm kernel oil. These can often be found in baked products, candy, fried foods, nondairy creamers, and whipped toppings. Solid fats and shortenings, including bacon fat, salt pork, lard, and butter. Nondairy cream substitutes, such as coffee creamers and sour cream substitutes. Salad dressings that are made of unknown oils, cheese, or sour cream. The items listed above may  not be a complete list of foods and beverages to avoid. Contact your dietitian for more information. This information is not intended to replace advice given to you by your health care provider. Make sure you discuss any questions you have with your health care provider. Document Released: 03/16/2012 Document Revised: 02/21/2016 Document Reviewed: 03/09/2014 Elsevier Interactive Patient Education  [employer].

## 2018-08-20 NOTE — Progress Notes (Signed)
Pt c/o chills.  Temp 102.0 orally.  Administered tylenol.  Dr. [place] Crane made aware.  Idolina Primer, RN

## 2018-08-20 NOTE — Plan of Care (Signed)
  Problem: Clinical Measurements: Goal: Respiratory complications will improve Outcome: Progressing Note:  No s/s of respiratory complications noted.   

## 2018-08-20 NOTE — Progress Notes (Signed)
PROGRESS NOTE  Ricky Singh WJX:[id number] DOB: 21-Jan-1946 DOA: 08/17/2018 PCP: No primary care provider on file.  HPI/Recap of past 24 hours: Byran Bilotti Singh is an 72 y.o. male PMH of HTN, stage III chronic kidney disease, chronic low back pain, glaucoma, presented to [facility] ED via PCPs office for complaints of fever and chills, diagnosed with strep throat at PCPs office and noted to have asymptomatic A. fib with RVR on EKG done in the office. Patient and spouse at bedside provided history. He was in usual state of health until 11/16 when he started experiencing chills and feverishness. He did not check his temperature. Apart from chronic intermittent stuffiness of his ears, he denied any other complaints. He denied headache, earache, sore throat, difficulty swallowing, runny nose, watery eyes, cough, dyspnea, chest pain, palpitations, dizziness, lightheadedness, nausea, vomiting, diarrhea or urinary symptoms. No known exposure to sick contacts. Denied noticing skin rash at home. Received flu shot in September of this year and pneumonia shot couple years ago. He took Tylenol and his fevers would break with sweats only to return. Since he was not improving, he decided to go to PCPs office where throat swab tested positive for strep throat but randomly performed EKG showed A. fib with RVR. Patient was then instructed to come to the ED for further evaluation. Patient states that it is not unusual for him to have a strep throat without pain or other symptoms. He clearly indicated that rash was noted in the ED prior to administration of several antibiotics.  08/19/18: seen and examined with his wife at his bedside. Denies chest pain or palpitations. Has blanchable rash on his back and upper extremities. Could be related to his strep throat.  08/20/2018: Patient seen and examined with his wife at his bedside.  Febrile overnight with T-max of 100.8.  Denies any new symptoms.  This afternoon patient had a  T-max of 102.  We will consult infectious disease due to no clear source of infection. Has pulmonary nodules found incidentally.  Assessment/Plan: Principal Problem:   Sepsis ([place]) Active Problems:   Strep pharyngitis with scarlet fever   Atrial fibrillation with RVR (HCC)   CKD (chronic kidney disease), stage III (HCC)   Fever  Fever of unknown origin  possibly multifactorial from recent strep vs chronic bronchitis versus pulmonary nodules versus others Tmax 102.3 on presentation T-max today 08/20/18 102F Blood cx x2 obtained on admission negative to date Urine culture negative to date Repeat blood cultures today x2 due to recurrent fever On rocephin since admission Calcitonin 0.24 Lactic acid 0.8 No leukocytosis Consult infectious disease to further assess Continue to monitor fever curve and WBC  New paroxysmal Afib with RvR  CHADSvASC score 2 Cardiology consulted and following Started on Xarelto and metoprolol tartrate 50 mg twice daily Also on flecainide 75 mg twice daily started today 2D echo revealed normal LVEF, No wall motion abnormalities Negative troponin less than 0.08  Recent Group A strep infection -treated  Pulmonary nodules Surveillance in 3 months.  Patient made aware  CKD3 Appears to be at his baseline Avoid nephrotoxic agents Monitor u/o Repeat BMP am  Chronic normocytic anemia No sign of overt bleeding Hg stable  Alcohol use disorder  CIWA protocol in place Continue thiamine and folic acid  Chronic thrombocytopenia plt 116K from 107 from 94k No sign of mucosal bleeding Continue to monitor May be related to his chronic alcohol use  Code Status: Full code  Family Communication: Wife at bedside  Disposition Plan: Home possibly 1 to 2 days or when infectious disease/cardiology signs off.   Consultants:  Cardiology  Infectious disease  Procedures:  None   Antimicrobials:  None  DVT prophylaxis:   SCDs   Objective: Vitals:   08/20/18 0637 08/20/18 0814 08/20/18 1033 08/20/18 1551  BP:   108/82   Pulse: 85  (!) 125   Resp:      Temp:  99.3 F (37.4 C)  (!) 102 F (38.9 C)  TempSrc:  Axillary  Oral  SpO2:      Weight:      Height:        Intake/Output Summary (Last 24 hours) at 08/20/2018 1559 Last data filed at 08/20/2018 1553 Gross per 24 hour  Intake 908 ml  Output 3150 ml  Net -2242 ml   Filed Weights   08/17/18 1323 08/17/18 1920 08/19/18 0654  Weight: 99.8 kg 103.4 kg 101.9 kg    Exam:  . General: 72 y.o. year-old male well-developed well-nourished in no acute distress.  Alert and oriented x3.   . Cardiovascular: Regular rate and rhythm with no rubs or gallops.  No JVD or thyromegaly noted.   Marland Kitchen Respiratory: Clear to auscultation with no wheezes or rales.  Good inspiratory effort. . Abdomen: Soft nontender nondistended with normal bowel sounds x4 quadrants. . Musculoskeletal: No lower extremity edema. 2/4 pulses in all 4 extremities. . Skin: Blanchable rash at torso and upper ext b/l . Psychiatry: Mood is appropriate for condition and setting   Data Reviewed: CBC: Recent Labs  Lab 08/17/18 1332 08/18/18 0451 08/19/18 0425 08/20/18 0426  WBC 4.7 3.2* 4.4 4.7  NEUTROABS 3.9  --   --   --   HGB 13.5 11.5* 11.9* 11.9*  HCT 42.0 34.7* 36.1* 35.1*  MCV 93.1 92.8 91.2 90.0  PLT 121* 94* 107* 161*   Basic Metabolic Panel: Recent Labs  Lab 08/17/18 1332 08/18/18 0451 08/19/18 0425 08/20/18 0426  NA 136 140 135 134*  K 4.2 4.1 3.7 3.9  CL 110 113* 108 106  CO2 18* 20* 18* 21*  GLUCOSE 123* 111* 103* 111*  BUN 21 22 17 19   CREATININE 1.59* 1.46* 1.40* 1.46*  CALCIUM 8.4* 8.0* 7.8* 7.9*   GFR: Estimated Creatinine Clearance: 58.3 mL/min (A) (by C-G formula based on SCr of 1.46 mg/dL (H)). Liver Function Tests: Recent Labs  Lab 08/17/18 2049  AST 20  ALT 20  ALKPHOS 45  BILITOT 0.5  PROT 5.8*  ALBUMIN 2.7*   No results for input(s):  LIPASE, AMYLASE in the last 168 hours. No results for input(s): AMMONIA in the last 168 hours. Coagulation Profile: No results for input(s): INR, PROTIME in the last 168 hours. Cardiac Enzymes: Recent Labs  Lab 08/19/18 1822  TROPONINI <0.03   BNP (last 3 results) No results for input(s): PROBNP in the last 8760 hours. HbA1C: No results for input(s): HGBA1C in the last 72 hours. CBG: No results for input(s): GLUCAP in the last 168 hours. Lipid Profile: No results for input(s): CHOL, HDL, LDLCALC, TRIG, CHOLHDL, LDLDIRECT in the last 72 hours. Thyroid Function Tests: Recent Labs    08/17/18 2049  TSH 1.412   Anemia Panel: No results for input(s): VITAMINB12, FOLATE, FERRITIN, TIBC, IRON, RETICCTPCT in the last 72 hours. Urine analysis:    Component Value Date/Time   COLORURINE YELLOW 08/17/2018 [place] 08/17/2018 1608   LABSPEC 1.015 08/17/2018 1608   PHURINE 5.0 08/17/2018 1608   GLUCOSEU NEGATIVE  08/17/2018 [place] 08/17/2018 [place] 08/17/2018 1608   KETONESUR NEGATIVE 08/17/2018 1608   PROTEINUR NEGATIVE 08/17/2018 1608   NITRITE NEGATIVE 08/17/2018 1608   LEUKOCYTESUR NEGATIVE 08/17/2018 1608   Sepsis Labs: @LABRCNTIP (procalcitonin:4,lacticidven:4)  ) Recent Results (from the past 240 hour(s))  Blood culture (routine x 2)     Status: None (Preliminary result)   Collection Time: 08/17/18  1:45 PM  Result Value Ref Range Status   Specimen Description BLOOD RIGHT ARM  Final   Special Requests   Final    BOTTLES DRAWN AEROBIC AND ANAEROBIC Blood Culture adequate volume   Culture   Final    NO GROWTH 3 DAYS Performed at [facility], [place] [address] [place], [place] [address]    Report Status PENDING  Incomplete  Urine culture     Status: None   Collection Time: 08/17/18  4:08 PM  Result Value Ref Range Status   Specimen Description URINE, CLEAN CATCH  Final   Special Requests NONE  Final   Culture    Final    NO GROWTH Performed at [facility], [place] [address], [place], [place] [address]    Report Status 08/18/2018 FINAL  Final  Blood culture (routine x 2)     Status: None (Preliminary result)   Collection Time: 08/17/18  4:30 PM  Result Value Ref Range Status   Specimen Description SITE NOT SPECIFIED  Final   Special Requests   Final    BOTTLES DRAWN AEROBIC AND ANAEROBIC Blood Culture adequate volume   Culture   Final    NO GROWTH 3 DAYS Performed at [facility], [address], [place], [place] [address]    Report Status PENDING  Incomplete  Culture, blood (Routine X 2) w Reflex to ID Panel     Status: None (Preliminary result)   Collection Time: 08/18/18  4:50 PM  Result Value Ref Range Status   Specimen Description BLOOD LEFT HAND  Final   Special Requests   Final    BOTTLES DRAWN AEROBIC ONLY Blood Culture adequate volume   Culture   Final    NO GROWTH 2 DAYS Performed at [facility], [place] [address], [place], [place] [address]    Report Status PENDING  Incomplete  Respiratory Panel by PCR     Status: None   Collection Time: 08/18/18  7:03 PM  Result Value Ref Range Status   Adenovirus NOT DETECTED NOT DETECTED Final   Coronavirus 229E NOT DETECTED NOT DETECTED Final   Coronavirus HKU1 NOT DETECTED NOT DETECTED Final   Coronavirus NL63 NOT DETECTED NOT DETECTED Final   Coronavirus OC43 NOT DETECTED NOT DETECTED Final   Metapneumovirus NOT DETECTED NOT DETECTED Final   Rhinovirus / Enterovirus NOT DETECTED NOT DETECTED Final   Influenza A NOT DETECTED NOT DETECTED Final   Influenza B NOT DETECTED NOT DETECTED Final   Parainfluenza Virus 1 NOT DETECTED NOT DETECTED Final   Parainfluenza Virus 2 NOT DETECTED NOT DETECTED Final   Parainfluenza Virus 3 NOT DETECTED NOT DETECTED Final   Parainfluenza Virus 4 NOT DETECTED NOT DETECTED Final   Respiratory Syncytial Virus NOT DETECTED NOT DETECTED Final   Bordetella pertussis NOT DETECTED NOT DETECTED  Final   Chlamydophila pneumoniae NOT DETECTED NOT DETECTED Final   Mycoplasma pneumoniae NOT DETECTED NOT DETECTED Final    Comment: Performed at [facility], [place] [address], [place], [place] [address]  Culture, blood (routine x 2)     Status: None (Preliminary result)   Collection Time: 08/20/18  4:00 AM  Result Value Ref Range Status   Specimen Description BLOOD LEFT ARM  Final   Special Requests   Final    BOTTLES DRAWN AEROBIC ONLY Blood Culture adequate volume   Culture   Final    NO GROWTH < 12 HOURS Performed at [facility], [address], [place], [place] [address]    Report Status PENDING  Incomplete  Culture, blood (routine x 2)     Status: None (Preliminary result)   Collection Time: 08/20/18  4:20 AM  Result Value Ref Range Status   Specimen Description BLOOD RIGHT HAND  Final   Special Requests   Final    BOTTLES DRAWN AEROBIC ONLY Blood Culture adequate volume   Culture   Final    NO GROWTH < 12 HOURS Performed at [facility], [place] [address] [place], [place] [address]    Report Status PENDING  Incomplete      Studies: No results found.  Scheduled Meds: . brimonidine  1 drop Right Eye BID  . digoxin  0.5 mg Intravenous Once  . flecainide  75 mg Oral Q12H  . folic acid  1 mg Oral Daily  . gabapentin  300 mg Oral BID  . latanoprost  1 drop Both Eyes QHS  . metoprolol tartrate  50 mg Oral BID  . multivitamin  1 tablet Oral Daily  . multivitamin with minerals  1 tablet Oral Daily  . rivaroxaban  20 mg Oral Q supper  . sodium chloride flush  3 mL Intravenous Q12H  . thiamine  100 mg Oral Daily   Or  . thiamine  100 mg Intravenous Daily    Continuous Infusions: . cefTRIAXone (ROCEPHIN)  IV 2 g (08/20/18 1553)     LOS: 3 days     Kayleen Memos, MD [facility] Pager [phone]  If 7PM-7AM, please contact night-coverage [email or web address] Password [facility] 08/20/2018, 3:59 PM

## 2018-08-20 NOTE — Consult Note (Signed)
[facility] for Infectious Disease    Date of Admission:  08/17/2018     Total days of antibiotics        Reason for Consult: Fever  Referring Provider: [place] Crane Primary Care Provider: No primary care provider on file.   Assessment/Plan:  Ricky Singh is a 72 year old male with previous history of CKD Stage 3 who was admitted with new onset atrial fibrillation with RVR as well as fevers and a rash of unclear origin. Initially believed to be Scarlett Fever and was given doses of Azithromycin, Ceftriaxone,Clindaymycin and Penicillin. Imaging with no evidence of pneumonia with incidentally found lung nodules with questionable clinical significance at this point. He has remained febrile despite being on Cefazolin and if this were indeed a Strep infection he would have improved. He is currently stable with no leukocytosis. Origin of the fever remains unclear with consideration for infection, autoimmune or malignant causes.  Multiple sets of blood cultures have remained without growth. Rash could be related to drug allergy or possible heat rash.   1. Discontinue current dose of ceftriaxone.  2. Continue to monitor off antibiotics including fevers, cultures, and WBC count. 3. A-fib per cardiology and primary team.   Principal Problem:   Fever Active Problems:   Sepsis ([place])   Strep pharyngitis with scarlet fever   Atrial fibrillation with RVR (HCC)   CKD (chronic kidney disease), stage III ([place])   . brimonidine  1 drop Right Eye BID  . digoxin  0.5 mg Intravenous Once  . flecainide  75 mg Oral Q12H  . folic acid  1 mg Oral Daily  . gabapentin  300 mg Oral BID  . latanoprost  1 drop Both Eyes QHS  . metoprolol tartrate  50 mg Oral BID  . multivitamin  1 tablet Oral Daily  . multivitamin with minerals  1 tablet Oral Daily  . rivaroxaban  20 mg Oral Q supper  . sodium chloride flush  3 mL Intravenous Q12H  . thiamine  100 mg Oral Daily   Or  . thiamine  100 mg Intravenous Daily      HPI: Ricky Singh is a 72 y.o. male with previous medical history significant for stage III chronic kidney disease, glaucoma, and hypertension who presented with the chief complaints of fever, tachycardia, and shortness of breath from his primary care office.  Symptoms started approximately 3 days prior to presentation.  He has had previous recurrent strep pharyngitis in the past.  In PCP office noted to have atrial fibrillation with rapid ventricular response with a rate of 167 which is a new finding for him.   In the ED temperature noted to be 102.9 with tachypnea of 23.  He had a fine erythematous morbilliform rash over his back and abdomen.  He was treated with IM penicillin and fluids when he experienced decreasing blood pressure.  Chest x-ray with COPD and patchy density in the right upper lobe with concern for parenchymal infiltrate or mass.  Follow-up CT scan with right pleural effusion and no evidence of pneumonia.  He had multiple small pulmonary nodules present of unsure clinical significance.  Ricky Singh was given Azithromycin, ceftriaxone, clindamycin, and 1.2 million units of penicillin. Current antimicrobial therapy is ceftriaxone and is on Day 4 of therapy. WBC count has been stable. Initially fever curve appeared to be improving and then spike a fever of 102 this afternoon. Multiple blood cultures have been drawn and all are without growth to  date.  Respiratory panel and influenza are negative.   Ricky Singh initially had chills about 6 days and then had a rash and fever. Seen in his primary care office with concern for Strep infection and also noted to have A-fib with RVR which he why he was sent to the hospital. The rash is on his trunk and upper/lower extremities with no distinct pattern. Denies itchiness. He has been sweating quite a bit recently. Denies any allergies to medications. Was recently in [place] and does travel. Denies stays in [place] or [place].       Review of Systems: Review of Systems  Constitutional: Positive for chills and fever. Negative for malaise/fatigue.  Respiratory: Negative for cough, sputum production, shortness of breath and wheezing.   Cardiovascular: Negative for chest pain and leg swelling.  Gastrointestinal: Negative for abdominal pain, constipation, diarrhea, nausea and vomiting.  Skin: Positive for rash.     Past Medical History:  Diagnosis Date  . Allergy   . Ankle fracture, left   . Arthritis   . Cataract   . Chronic kidney disease (CKD), stage III (moderate) (HCC)   . Femur fracture, right ([place])   . Glaucoma   . Hypertension   . Macular degeneration   . Scoliosis     Social History   Tobacco Use  . Smoking status: Former Research scientist (life sciences)  . Smokeless tobacco: Never Used  Substance Use Topics  . Alcohol use: Yes    Alcohol/week: 14.0 standard drinks    Types: 14 Cans of beer per week    Comment: 2 beers nightly  . Drug use: No    Family History  Problem Relation Age of Onset  . Lymphoma Father 68       started in testicles  . Hypertension Mother   . Angina Maternal Grandfather   . Kidney cancer Cousin   . Colon cancer Neg Hx   . Esophageal cancer Neg Hx   . Rectal cancer Neg Hx   . Stomach cancer Neg Hx     No Known Allergies  OBJECTIVE: Blood pressure 108/82, pulse (!) 125, temperature 98.4 F (36.9 C), temperature source Oral, resp. rate 15, height 6\' 3"  (1.905 m), weight 101.9 kg, SpO2 98 %.  Physical Exam  Constitutional: He is oriented to person, place, and time. He appears well-developed and well-nourished. No distress.  Cardiovascular: Normal rate, regular rhythm, normal heart sounds and intact distal pulses. Exam reveals no gallop and no friction rub.  No murmur heard. Pulmonary/Chest: Effort normal and breath sounds normal. No respiratory distress. He has no wheezes. He has no rales. He exhibits no tenderness.  Abdominal: Soft. Bowel sounds are normal. He exhibits no distension  and no mass. There is no tenderness. There is no guarding.  Neurological: He is alert and oriented to person, place, and time.  Skin: Skin is warm and dry. Rash (Generalized, diffuse, blanchable areas with some confluent on trunk and extremities) noted.  Psychiatric: He has a normal mood and affect.    Lab Results Lab Results  Component Value Date   WBC 4.7 08/20/2018   HGB 11.9 (L) 08/20/2018   HCT 35.1 (L) 08/20/2018   MCV 90.0 08/20/2018   PLT 116 (L) 08/20/2018    Lab Results  Component Value Date   CREATININE 1.46 (H) 08/20/2018   BUN 19 08/20/2018   NA 134 (L) 08/20/2018   K 3.9 08/20/2018   CL 106 08/20/2018   CO2 21 (L) 08/20/2018    Lab Results  Component Value Date   ALT 20 08/17/2018   AST 20 08/17/2018   ALKPHOS 45 08/17/2018   BILITOT 0.5 08/17/2018     Microbiology: Recent Results (from the past 240 hour(s))  Blood culture (routine x 2)     Status: None (Preliminary result)   Collection Time: 08/17/18  1:45 PM  Result Value Ref Range Status   Specimen Description BLOOD RIGHT ARM  Final   Special Requests   Final    BOTTLES DRAWN AEROBIC AND ANAEROBIC Blood Culture adequate volume   Culture   Final    NO GROWTH 3 DAYS Performed at [facility], [place] [address], [place], [place] [address]    Report Status PENDING  Incomplete  Urine culture     Status: None   Collection Time: 08/17/18  4:08 PM  Result Value Ref Range Status   Specimen Description URINE, CLEAN CATCH  Final   Special Requests NONE  Final   Culture   Final    NO GROWTH Performed at [facility], [place] [address] [place], [place] [address]    Report Status 08/18/2018 FINAL  Final  Blood culture (routine x 2)     Status: None (Preliminary result)   Collection Time: 08/17/18  4:30 PM  Result Value Ref Range Status   Specimen Description SITE NOT SPECIFIED  Final   Special Requests   Final    BOTTLES DRAWN AEROBIC AND ANAEROBIC Blood Culture adequate volume   Culture   Final     NO GROWTH 3 DAYS Performed at [facility], [address], [place], [place] [address]    Report Status PENDING  Incomplete  Culture, blood (Routine X 2) w Reflex to ID Panel     Status: None (Preliminary result)   Collection Time: 08/18/18  4:50 PM  Result Value Ref Range Status   Specimen Description BLOOD LEFT HAND  Final   Special Requests   Final    BOTTLES DRAWN AEROBIC ONLY Blood Culture adequate volume   Culture   Final    NO GROWTH 2 DAYS Performed at [facility], [place] [address], [place], [place] [address]    Report Status PENDING  Incomplete  Respiratory Panel by PCR     Status: None   Collection Time: 08/18/18  7:03 PM  Result Value Ref Range Status   Adenovirus NOT DETECTED NOT DETECTED Final   Coronavirus 229E NOT DETECTED NOT DETECTED Final   Coronavirus HKU1 NOT DETECTED NOT DETECTED Final   Coronavirus NL63 NOT DETECTED NOT DETECTED Final   Coronavirus OC43 NOT DETECTED NOT DETECTED Final   Metapneumovirus NOT DETECTED NOT DETECTED Final   Rhinovirus / Enterovirus NOT DETECTED NOT DETECTED Final   Influenza A NOT DETECTED NOT DETECTED Final   Influenza B NOT DETECTED NOT DETECTED Final   Parainfluenza Virus 1 NOT DETECTED NOT DETECTED Final   Parainfluenza Virus 2 NOT DETECTED NOT DETECTED Final   Parainfluenza Virus 3 NOT DETECTED NOT DETECTED Final   Parainfluenza Virus 4 NOT DETECTED NOT DETECTED Final   Respiratory Syncytial Virus NOT DETECTED NOT DETECTED Final   Bordetella pertussis NOT DETECTED NOT DETECTED Final   Chlamydophila pneumoniae NOT DETECTED NOT DETECTED Final   Mycoplasma pneumoniae NOT DETECTED NOT DETECTED Final    Comment: Performed at [facility], [place] [address] [place],  [address]  Culture, blood (routine x 2)     Status: None (Preliminary result)   Collection Time: 08/20/18  4:00 AM  Result Value Ref Range Status   Specimen Description BLOOD LEFT ARM  Final   Special Requests   Final    BOTTLES DRAWN  AEROBIC ONLY Blood Culture adequate volume   Culture   Final    NO GROWTH < 12 HOURS Performed at [facility], [address], [place], [place] [address]    Report Status PENDING  Incomplete  Culture, blood (routine x 2)     Status: None (Preliminary result)   Collection Time: 08/20/18  4:20 AM  Result Value Ref Range Status   Specimen Description BLOOD RIGHT HAND  Final   Special Requests   Final    BOTTLES DRAWN AEROBIC ONLY Blood Culture adequate volume   Culture   Final    NO GROWTH < 12 HOURS Performed at [facility], [place] [address] [place], [place] [address]    Report Status PENDING  Incomplete     Terri Piedra, [place] [facility] [place] [phone] Pager  08/20/2018  6:16 PM

## 2018-08-20 NOTE — Progress Notes (Signed)
Pt is back in A fib. Pt states he got up used a sink. HR is in 110- 130s.  Idolina Primer, RN

## 2018-08-21 DIAGNOSIS — R509 Fever, unspecified: Secondary | ICD-10-CM

## 2018-08-21 DIAGNOSIS — I480 Paroxysmal atrial fibrillation: Secondary | ICD-10-CM

## 2018-08-21 DIAGNOSIS — R21 Rash and other nonspecific skin eruption: Secondary | ICD-10-CM

## 2018-08-21 DIAGNOSIS — R502 Drug induced fever: Secondary | ICD-10-CM

## 2018-08-21 LAB — COMPREHENSIVE METABOLIC PANEL
ALT: 31 U/L (ref 0–44)
AST: 26 U/L (ref 15–41)
Albumin: 2.7 g/dL — ABNORMAL LOW (ref 3.5–5.0)
Alkaline Phosphatase: 53 U/L (ref 38–126)
Anion gap: 10 (ref 5–15)
BILIRUBIN TOTAL: 0.8 mg/dL (ref 0.3–1.2)
BUN: 20 mg/dL (ref 8–23)
CALCIUM: 8.1 mg/dL — AB (ref 8.9–10.3)
CO2: 21 mmol/L — ABNORMAL LOW (ref 22–32)
CREATININE: 1.48 mg/dL — AB (ref 0.61–1.24)
Chloride: 105 mmol/L (ref 98–111)
GFR calc Af Amer: 52 mL/min — ABNORMAL LOW (ref >60)
GFR, EST NON AFRICAN AMERICAN: 45 mL/min — AB (ref >60)
Glucose, Bld: 113 mg/dL — ABNORMAL HIGH (ref 70–99)
POTASSIUM: 3.8 mmol/L (ref 3.5–5.1)
Sodium: 136 mmol/L (ref 135–145)
TOTAL PROTEIN: 6 g/dL — AB (ref 6.5–8.1)

## 2018-08-21 LAB — CBC WITH DIFFERENTIAL/PLATELET
Abs Immature Granulocytes: 0.02 10*3/uL (ref 0.00–0.07)
Basophils Absolute: 0 10*3/uL (ref 0.0–0.1)
Basophils Relative: 0 %
EOS ABS: 0.3 10*3/uL (ref 0.0–0.5)
EOS PCT: 7 %
HEMATOCRIT: 38.8 % — AB (ref 39.0–52.0)
HEMOGLOBIN: 12.8 g/dL — AB (ref 13.0–17.0)
IMMATURE GRANULOCYTES: 0 %
LYMPHS ABS: 0.5 10*3/uL — AB (ref 0.7–4.0)
Lymphocytes Relative: 11 %
MCH: 30.2 pg (ref 26.0–34.0)
MCHC: 33 g/dL (ref 30.0–36.0)
MCV: 91.5 fL (ref 80.0–100.0)
MONOS PCT: 8 %
Monocytes Absolute: 0.4 10*3/uL (ref 0.1–1.0)
NEUTROS PCT: 74 %
Neutro Abs: 3.5 10*3/uL (ref 1.7–7.7)
Platelets: 154 10*3/uL (ref 150–400)
RBC: 4.24 MIL/uL (ref 4.22–5.81)
RDW: 13.2 % (ref 11.5–15.5)
WBC: 4.7 10*3/uL (ref 4.0–10.5)
nRBC: 0 % (ref 0.0–0.2)

## 2018-08-21 NOTE — Progress Notes (Signed)
PROGRESS NOTE  Arles Rumbold Berman PFX:[id number] DOB: [birth year] DOA: 08/17/2018 PCP: No primary care provider on file.  HPI/Recap of past 24 hours: Ricky Singh is an 72 y.o. male PMH of HTN, stage III chronic kidney disease, chronic low back pain, glaucoma, presented to [facility] ED via PCPs office for complaints of fever and chills, diagnosed with strep throat at PCPs office and noted to have asymptomatic A. fib with RVR on EKG done in the office. Patient and spouse at bedside provided history. He was in usual state of health until 11/16 when he started experiencing chills and feverishness. He did not check his temperature. Apart from chronic intermittent stuffiness of his ears, he denied any other complaints. He denied headache, earache, sore throat, difficulty swallowing, runny nose, watery eyes, cough, dyspnea, chest pain, palpitations, dizziness, lightheadedness, nausea, vomiting, diarrhea or urinary symptoms. No known exposure to sick contacts. Denied noticing skin rash at home. Received flu shot in September of this year and pneumonia shot couple years ago. He took Tylenol and his fevers would break with sweats only to return. Since he was not improving, he decided to go to PCPs office where throat swab tested positive for strep throat but randomly performed EKG showed A. fib with RVR. Patient was then instructed to come to the ED for further evaluation. Patient states that it is not unusual for him to have a strep throat without pain or other symptoms. He clearly indicated that rash was noted in the ED prior to administration of several antibiotics.  Subjective: -Patient had fever overnight, otherwise denies any complaints  Assessment/Plan: Principal Problem:   Fever Active Problems:   Sepsis (HCC)   Strep pharyngitis with scarlet fever   Atrial fibrillation with RVR (HCC)   CKD (chronic kidney disease), stage III (HCC)  Fever of unknown origin / rash -ID input greatly appreciated,  possibly due to strep infection, as well currently may be related to antibiotics, continue to hold antibiotics, monitor off antibiotics especially he is nontoxic-appearing, . possibly multifactorial from recent strep vs chronic bronchitis versus pulmonary nodules versus others Blood cx x2 obtained on admission negative to date Urine culture negative to date Repeat blood cultures today x2 due to recurrent fever On rocephin since admission Calcitonin 0.24 Lactic acid 0.8 No leukocytosis Consult infectious disease to further assess Continue to monitor fever curve and WBC  New paroxysmal Afib with RvR  CHADSvASC score 2 Management per cardiology, he started on flecainide and metoprolol,  Recent Group A strep infection -treated  Pulmonary nodules Surveillance in 3 months.  Patient made aware  CKD3 Appears to be at his baseline Avoid nephrotoxic agents Monitor u/o Repeat BMP am  Chronic normocytic anemia No sign of overt bleeding Hg stable  Alcohol use disorder  CIWA protocol in place Continue thiamine and folic acid  Chronic thrombocytopenia plt 116K from 107 from 94k No sign of mucosal bleeding Continue to monitor May be related to his chronic alcohol use  Code Status: Full code  Family Communication: Wife at bedside  Disposition Plan: Home possibly 1 to 2 days or when infectious disease/cardiology signs off.   Consultants:  Cardiology  Infectious disease  Procedures:  None   Antimicrobials:  None  DVT prophylaxis:  SCDs, on Xarelto   Objective: Vitals:   08/20/18 2146 08/21/18 0103 08/21/18 0456 08/21/18 1143  BP: 108/65  130/86 94/74  Pulse: 77  68 73  Resp: (!) 24   19  Temp: 99.7 F (37.6 C) (!) 101.5  F (38.6 C) 98.9 F (37.2 C) 99.7 F (37.6 C)  TempSrc: Oral Oral Oral Oral  SpO2: 98%  98%   Weight:   97.9 kg   Height:        Intake/Output Summary (Last 24 hours) at 08/21/2018 1422 Last data filed at 08/21/2018 0900 Gross per 24  hour  Intake 1300 ml  Output 500 ml  Net 800 ml   Filed Weights   08/17/18 1920 08/19/18 0654 08/21/18 0456  Weight: 103.4 kg 101.9 kg 97.9 kg    Exam:  Awake Alert, Oriented X 3, No new F.N deficits, Normal affect Symmetrical Chest wall movement, Good air movement bilaterally, CTAB Irregular irregular, no Gallops,Rubs or new Murmurs, No Parasternal Heave +ve B.Sounds, Abd Soft, No tenderness, No rebound - guarding or rigidity. No Cyanosis, Clubbing or edema, has diffuse rash, but significantly subsided    Data Reviewed: CBC: Recent Labs  Lab 08/17/18 1332 08/18/18 0451 08/19/18 0425 08/20/18 0426 08/21/18 0446  WBC 4.7 3.2* 4.4 4.7 4.7  NEUTROABS 3.9  --   --   --  3.5  HGB 13.5 11.5* 11.9* 11.9* 12.8*  HCT 42.0 34.7* 36.1* 35.1* 38.8*  MCV 93.1 92.8 91.2 90.0 91.5  PLT 121* 94* 107* 116* 542   Basic Metabolic Panel: Recent Labs  Lab 08/17/18 1332 08/18/18 0451 08/19/18 0425 08/20/18 0426 08/21/18 0446  NA 136 140 135 134* 136  K 4.2 4.1 3.7 3.9 3.8  CL 110 113* 108 106 105  CO2 18* 20* 18* 21* 21*  GLUCOSE 123* 111* 103* 111* 113*  BUN 21 22 17 19 20   CREATININE 1.59* 1.46* 1.40* 1.46* 1.48*  CALCIUM 8.4* 8.0* 7.8* 7.9* 8.1*   GFR: Estimated Creatinine Clearance: 53.1 mL/min (A) (by C-G formula based on SCr of 1.48 mg/dL (H)). Liver Function Tests: Recent Labs  Lab 08/17/18 2049 08/21/18 0446  AST 20 26  ALT 20 31  ALKPHOS 45 53  BILITOT 0.5 0.8  PROT 5.8* 6.0*  ALBUMIN 2.7* 2.7*   No results for input(s): LIPASE, AMYLASE in the last 168 hours. No results for input(s): AMMONIA in the last 168 hours. Coagulation Profile: No results for input(s): INR, PROTIME in the last 168 hours. Cardiac Enzymes: Recent Labs  Lab 08/19/18 1822  TROPONINI <0.03   BNP (last 3 results) No results for input(s): PROBNP in the last 8760 hours. HbA1C: No results for input(s): HGBA1C in the last 72 hours. CBG: No results for input(s): GLUCAP in the last 168  hours. Lipid Profile: No results for input(s): CHOL, HDL, LDLCALC, TRIG, CHOLHDL, LDLDIRECT in the last 72 hours. Thyroid Function Tests: No results for input(s): TSH, T4TOTAL, FREET4, T3FREE, THYROIDAB in the last 72 hours. Anemia Panel: No results for input(s): VITAMINB12, FOLATE, FERRITIN, TIBC, IRON, RETICCTPCT in the last 72 hours. Urine analysis:    Component Value Date/Time   COLORURINE YELLOW 08/17/2018 [place] 08/17/2018 1608   LABSPEC 1.015 08/17/2018 1608   PHURINE 5.0 08/17/2018 1608   GLUCOSEU NEGATIVE 08/17/2018 1608   HGBUR NEGATIVE 08/17/2018 1608   BILIRUBINUR NEGATIVE 08/17/2018 1608   KETONESUR NEGATIVE 08/17/2018 1608   PROTEINUR NEGATIVE 08/17/2018 1608   NITRITE NEGATIVE 08/17/2018 1608   LEUKOCYTESUR NEGATIVE 08/17/2018 1608   Sepsis Labs: @LABRCNTIP (procalcitonin:4,lacticidven:4)  ) Recent Results (from the past 240 hour(s))  Blood culture (routine x 2)     Status: None (Preliminary result)   Collection Time: 08/17/18  1:45 PM  Result Value Ref Range Status  Specimen Description BLOOD RIGHT ARM  Final   Special Requests   Final    BOTTLES DRAWN AEROBIC AND ANAEROBIC Blood Culture adequate volume   Culture   Final    NO GROWTH 4 DAYS Performed at [facility], [address], [place], [place] [address]    Report Status PENDING  Incomplete  Urine culture     Status: None   Collection Time: 08/17/18  4:08 PM  Result Value Ref Range Status   Specimen Description URINE, CLEAN CATCH  Final   Special Requests NONE  Final   Culture   Final    NO GROWTH Performed at [facility], [place] [address], [place], [place] [address]    Report Status 08/18/2018 FINAL  Final  Blood culture (routine x 2)     Status: None (Preliminary result)   Collection Time: 08/17/18  4:30 PM  Result Value Ref Range Status   Specimen Description SITE NOT SPECIFIED  Final   Special Requests   Final    BOTTLES DRAWN AEROBIC AND ANAEROBIC Blood  Culture adequate volume   Culture   Final    NO GROWTH 4 DAYS Performed at [facility], [address] [place], [place] [address]    Report Status PENDING  Incomplete  Culture, blood (Routine X 2) w Reflex to ID Panel     Status: None (Preliminary result)   Collection Time: 08/18/18  4:50 PM  Result Value Ref Range Status   Specimen Description BLOOD LEFT HAND  Final   Special Requests   Final    BOTTLES DRAWN AEROBIC ONLY Blood Culture adequate volume   Culture   Final    NO GROWTH 3 DAYS Performed at [facility], [place] [address] [place], [place] [address]    Report Status PENDING  Incomplete  Respiratory Panel by PCR     Status: None   Collection Time: 08/18/18  7:03 PM  Result Value Ref Range Status   Adenovirus NOT DETECTED NOT DETECTED Final   Coronavirus 229E NOT DETECTED NOT DETECTED Final   Coronavirus HKU1 NOT DETECTED NOT DETECTED Final   Coronavirus NL63 NOT DETECTED NOT DETECTED Final   Coronavirus OC43 NOT DETECTED NOT DETECTED Final   Metapneumovirus NOT DETECTED NOT DETECTED Final   Rhinovirus / Enterovirus NOT DETECTED NOT DETECTED Final   Influenza A NOT DETECTED NOT DETECTED Final   Influenza B NOT DETECTED NOT DETECTED Final   Parainfluenza Virus 1 NOT DETECTED NOT DETECTED Final   Parainfluenza Virus 2 NOT DETECTED NOT DETECTED Final   Parainfluenza Virus 3 NOT DETECTED NOT DETECTED Final   Parainfluenza Virus 4 NOT DETECTED NOT DETECTED Final   Respiratory Syncytial Virus NOT DETECTED NOT DETECTED Final   Bordetella pertussis NOT DETECTED NOT DETECTED Final   Chlamydophila pneumoniae NOT DETECTED NOT DETECTED Final   Mycoplasma pneumoniae NOT DETECTED NOT DETECTED Final    Comment: Performed at [facility], [place] [address], [place],  [address]  Culture, blood (routine x 2)     Status: None (Preliminary result)   Collection Time: 08/20/18  4:00 AM  Result Value Ref Range Status   Specimen Description BLOOD LEFT ARM  Final    Special Requests   Final    BOTTLES DRAWN AEROBIC ONLY Blood Culture adequate volume   Culture   Final    NO GROWTH 1 DAY Performed at [facility], [place] [address] [place],  [address]  Report Status PENDING  Incomplete  Culture, blood (routine x 2)     Status: None (Preliminary result)   Collection Time: 08/20/18  4:20 AM  Result Value Ref Range Status   Specimen Description BLOOD RIGHT HAND  Final   Special Requests   Final    BOTTLES DRAWN AEROBIC ONLY Blood Culture adequate volume   Culture   Final    NO GROWTH 1 DAY Performed at [facility], [address], [place], [place] [address]    Report Status PENDING  Incomplete      Studies: No results found.  Scheduled Meds: . brimonidine  1 drop Right Eye BID  . digoxin  0.5 mg Intravenous Once  . flecainide  75 mg Oral Q12H  . folic acid  1 mg Oral Daily  . gabapentin  300 mg Oral BID  . latanoprost  1 drop Both Eyes QHS  . metoprolol tartrate  50 mg Oral BID  . multivitamin  1 tablet Oral Daily  . multivitamin with minerals  1 tablet Oral Daily  . rivaroxaban  20 mg Oral Q supper  . sodium chloride flush  3 mL Intravenous Q12H  . thiamine  100 mg Oral Daily   Or  . thiamine  100 mg Intravenous Daily    Continuous Infusions:    LOS: 4 days     Phillips Climes, MD [facility]  If 7PM-7AM, please contact night-coverage [email or web address] Password [email or web address] 08/21/2018, 2:22 PM

## 2018-08-21 NOTE — Progress Notes (Signed)
Progress Note  Patient Name: Ricky Singh Date of Encounter: 08/21/2018  Primary Cardiologist: Dorris Carnes, MD   Subjective   No chest pain or sob. Minimal palpitations.   Inpatient Medications    Scheduled Meds: . brimonidine  1 drop Right Eye BID  . digoxin  0.5 mg Intravenous Once  . flecainide  75 mg Oral Q12H  . folic acid  1 mg Oral Daily  . gabapentin  300 mg Oral BID  . latanoprost  1 drop Both Eyes QHS  . metoprolol tartrate  50 mg Oral BID  . multivitamin  1 tablet Oral Daily  . multivitamin with minerals  1 tablet Oral Daily  . rivaroxaban  20 mg Oral Q supper  . sodium chloride flush  3 mL Intravenous Q12H  . thiamine  100 mg Oral Daily   Or  . thiamine  100 mg Intravenous Daily   Continuous Infusions:  PRN Meds: acetaminophen **OR** acetaminophen, albuterol   Vital Signs    Vitals:   08/20/18 1747 08/20/18 2146 08/21/18 0103 08/21/18 0456  BP:  108/65  130/86  Pulse:  77  68  Resp:  (!) 24    Temp: 98.4 F (36.9 C) 99.7 F (37.6 C) (!) 101.5 F (38.6 C) 98.9 F (37.2 C)  TempSrc: Oral Oral Oral Oral  SpO2:  98%  98%  Weight:    97.9 kg  Height:        Intake/Output Summary (Last 24 hours) at 08/21/2018 1106 Last data filed at 08/20/2018 2200 Gross per 24 hour  Intake 1300 ml  Output 1000 ml  Net 300 ml   Filed Weights   08/17/18 1920 08/19/18 0654 08/21/18 0456  Weight: 103.4 kg 101.9 kg 97.9 kg    Telemetry    nsr with PAF - Personally Reviewed  ECG    nsr - Personally Reviewed  Physical Exam   GEN: No acute distress.   Neck: No JVD Cardiac: RRR, no murmurs, rubs, or gallops.  Respiratory: Clear to auscultation bilaterally. GI: Soft, nontender, non-distended  MS: No edema; No deformity. Neuro:  Nonfocal  Psych: Normal affect   Labs    Chemistry Recent Labs  Lab 08/17/18 2049  08/19/18 0425 08/20/18 0426 08/21/18 0446  NA  --    < > 135 134* 136  K  --    < > 3.7 3.9 3.8  CL  --    < > 108 106 105  CO2  --     < > 18* 21* 21*  GLUCOSE  --    < > 103* 111* 113*  BUN  --    < > 17 19 20   CREATININE  --    < > 1.40* 1.46* 1.48*  CALCIUM  --    < > 7.8* 7.9* 8.1*  PROT 5.8*  --   --   --  6.0*  ALBUMIN 2.7*  --   --   --  2.7*  AST 20  --   --   --  26  ALT 20  --   --   --  31  ALKPHOS 45  --   --   --  53  BILITOT 0.5  --   --   --  0.8  GFRNONAA  --    < > 48* 46* 45*  GFRAA  --    < > 56* 53* 52*  ANIONGAP  --    < > 9 7 10    < > =  values in this interval not displayed.     Hematology Recent Labs  Lab 08/19/18 0425 08/20/18 0426 08/21/18 0446  WBC 4.4 4.7 4.7  RBC 3.96* 3.90* 4.24  HGB 11.9* 11.9* 12.8*  HCT 36.1* 35.1* 38.8*  MCV 91.2 90.0 91.5  MCH 30.1 30.5 30.2  MCHC 33.0 33.9 33.0  RDW 12.9 13.0 13.2  PLT 107* 116* 154    Cardiac Enzymes Recent Labs  Lab 08/19/18 1822  TROPONINI <0.03    Recent Labs  Lab 08/17/18 1343  TROPIPOC 0.03     BNPNo results for input(s): BNP, PROBNP in the last 168 hours.   DDimer No results for input(s): DDIMER in the last 168 hours.   Radiology    No results found.  Cardiac Studies   none  Patient Profile     72 y.o. male admitted with atrial fib with an RVR, fever and chills  Assessment & Plan    1. PAF - he is mostly maintaining NSR. He will continue his current meds. He will need prescriptions.  2. Strep infection - he has had his anti-biotics stopped.      For questions or updates, please contact [facility] Please consult [email or web address] for contact info under Cardiology/STEMI.      Signed, [name], MD  08/21/2018, 11:06 AM  Patient ID: Ricky Singh, male   DOB: 08/19/1946, 72 y.o.   MRN: [id number]

## 2018-08-21 NOTE — Progress Notes (Signed)
INFECTIOUS DISEASE PROGRESS NOTE  ID: Ricky Singh is a 72 y.o. male with  Principal Problem:   Fever Active Problems:   Sepsis ([place])   Strep pharyngitis with scarlet fever   Atrial fibrillation with RVR ([facility])   CKD (chronic kidney disease), stage III ([facility])  Subjective: Thinks his temp is increasing. Has ice packs.  No sob, no oral lesions, believes his rash is improving.   Abtx:  Anti-infectives (From admission, onward)   Start     Dose/Rate Route Frequency Ordered Stop   08/18/18 1600  cefTRIAXone (ROCEPHIN) 1 g in sodium chloride 0.9 % 100 mL IVPB  Status:  Discontinued     1 g 200 mL/hr over 30 Minutes Intravenous Every 24 hours 08/17/18 2016 08/18/18 1108   08/18/18 1600  cefTRIAXone (ROCEPHIN) 2 g in sodium chloride 0.9 % 100 mL IVPB  Status:  Discontinued     2 g 200 mL/hr over 30 Minutes Intravenous Every 24 hours 08/18/18 1108 08/20/18 1619   08/17/18 1600  clindamycin (CLEOCIN) IVPB 600 mg     600 mg 100 mL/hr over 30 Minutes Intravenous  Once 08/17/18 1545 08/17/18 1846   08/17/18 1545  cefTRIAXone (ROCEPHIN) 1 g in sodium chloride 0.9 % 100 mL IVPB     1 g 200 mL/hr over 30 Minutes Intravenous  Once 08/17/18 1542 08/17/18 1721   08/17/18 1545  azithromycin (ZITHROMAX) 500 mg in sodium chloride 0.9 % 250 mL IVPB     500 mg 250 mL/hr over 60 Minutes Intravenous  Once 08/17/18 1542 08/17/18 1846   08/17/18 1330  penicillin g benzathine (BICILLIN LA) 1200000 UNIT/2ML injection 1.2 Million Units     1.2 Million Units Intramuscular  Once 08/17/18 1325 08/17/18 1352      Medications:  Scheduled: . brimonidine  1 drop Right Eye BID  . digoxin  0.5 mg Intravenous Once  . flecainide  75 mg Oral Q12H  . folic acid  1 mg Oral Daily  . gabapentin  300 mg Oral BID  . latanoprost  1 drop Both Eyes QHS  . metoprolol tartrate  50 mg Oral BID  . multivitamin  1 tablet Oral Daily  . multivitamin with minerals  1 tablet Oral Daily  . rivaroxaban  20 mg Oral Q supper  .  sodium chloride flush  3 mL Intravenous Q12H  . thiamine  100 mg Oral Daily   Or  . thiamine  100 mg Intravenous Daily    Objective: Vital signs in last 24 hours: Temp:  [98.4 F (36.9 C)-102 F (38.9 C)] 99.7 F (37.6 C) (11/23 1143) Pulse Rate:  [68-77] 73 (11/23 1143) Resp:  [19-24] 19 (11/23 1143) BP: (94-130)/(65-86) 94/74 (11/23 1143) SpO2:  [98 %] 98 % (11/23 0456) Weight:  [97.9 kg] 97.9 kg (11/23 0456)   General appearance: alert, cooperative and no distress Throat: lips, mucosa, and tongue normal; teeth and gums normal Skin: macular - back, elbows, knees.   Lab Results Recent Labs    08/20/18 0426 08/21/18 0446  WBC 4.7 4.7  HGB 11.9* 12.8*  HCT 35.1* 38.8*  NA 134* 136  K 3.9 3.8  CL 106 105  CO2 21* 21*  BUN 19 20  CREATININE 1.46* 1.48*   Liver Panel Recent Labs    08/21/18 0446  PROT 6.0*  ALBUMIN 2.7*  AST 26  ALT 31  ALKPHOS 53  BILITOT 0.8   Sedimentation Rate Recent Labs    08/19/18 1822  ESRSEDRATE 14  C-Reactive Protein No results for input(s): CRP in the last 72 hours.  Microbiology: Recent Results (from the past 240 hour(s))  Blood culture (routine x 2)     Status: None (Preliminary result)   Collection Time: 08/17/18  1:45 PM  Result Value Ref Range Status   Specimen Description BLOOD RIGHT ARM  Final   Special Requests   Final    BOTTLES DRAWN AEROBIC AND ANAEROBIC Blood Culture adequate volume   Culture   Final    NO GROWTH 3 DAYS Performed at [facility], [address] [place], [place] [address]    Report Status PENDING  Incomplete  Urine culture     Status: None   Collection Time: 08/17/18  4:08 PM  Result Value Ref Range Status   Specimen Description URINE, CLEAN CATCH  Final   Special Requests NONE  Final   Culture   Final    NO GROWTH Performed at [facility], [place] [address] [place], [place] [address]    Report Status 08/18/2018 FINAL  Final  Blood culture (routine x 2)     Status: None  (Preliminary result)   Collection Time: 08/17/18  4:30 PM  Result Value Ref Range Status   Specimen Description SITE NOT SPECIFIED  Final   Special Requests   Final    BOTTLES DRAWN AEROBIC AND ANAEROBIC Blood Culture adequate volume   Culture   Final    NO GROWTH 3 DAYS Performed at [facility], [address], [place], [place] [address]    Report Status PENDING  Incomplete  Culture, blood (Routine X 2) w Reflex to ID Panel     Status: None (Preliminary result)   Collection Time: 08/18/18  4:50 PM  Result Value Ref Range Status   Specimen Description BLOOD LEFT HAND  Final   Special Requests   Final    BOTTLES DRAWN AEROBIC ONLY Blood Culture adequate volume   Culture   Final    NO GROWTH 2 DAYS Performed at [facility], [place] [address], [place], [place] [address]    Report Status PENDING  Incomplete  Respiratory Panel by PCR     Status: None   Collection Time: 08/18/18  7:03 PM  Result Value Ref Range Status   Adenovirus NOT DETECTED NOT DETECTED Final   Coronavirus 229E NOT DETECTED NOT DETECTED Final   Coronavirus HKU1 NOT DETECTED NOT DETECTED Final   Coronavirus NL63 NOT DETECTED NOT DETECTED Final   Coronavirus OC43 NOT DETECTED NOT DETECTED Final   Metapneumovirus NOT DETECTED NOT DETECTED Final   Rhinovirus / Enterovirus NOT DETECTED NOT DETECTED Final   Influenza A NOT DETECTED NOT DETECTED Final   Influenza B NOT DETECTED NOT DETECTED Final   Parainfluenza Virus 1 NOT DETECTED NOT DETECTED Final   Parainfluenza Virus 2 NOT DETECTED NOT DETECTED Final   Parainfluenza Virus 3 NOT DETECTED NOT DETECTED Final   Parainfluenza Virus 4 NOT DETECTED NOT DETECTED Final   Respiratory Syncytial Virus NOT DETECTED NOT DETECTED Final   Bordetella pertussis NOT DETECTED NOT DETECTED Final   Chlamydophila pneumoniae NOT DETECTED NOT DETECTED Final   Mycoplasma pneumoniae NOT DETECTED NOT DETECTED Final    Comment: Performed at [facility], [place] [address], [place], [place] [address]  Culture, blood (routine x 2)     Status: None (Preliminary result)   Collection Time: 08/20/18  4:00 AM  Result Value Ref Range Status   Specimen Description  BLOOD LEFT ARM  Final   Special Requests   Final    BOTTLES DRAWN AEROBIC ONLY Blood Culture adequate volume   Culture   Final    NO GROWTH < 12 HOURS Performed at [facility], [address], [place], [place] [address]    Report Status PENDING  Incomplete  Culture, blood (routine x 2)     Status: None (Preliminary result)   Collection Time: 08/20/18  4:20 AM  Result Value Ref Range Status   Specimen Description BLOOD RIGHT HAND  Final   Special Requests   Final    BOTTLES DRAWN AEROBIC ONLY Blood Culture adequate volume   Culture   Final    NO GROWTH < 12 HOURS Performed at [facility], [place] [address], [place], [place] [address]    Report Status PENDING  Incomplete    Studies/Results: No results found.   Assessment/Plan: Fever, rash (drug rash vs strep ) parox afib  Total days of antibiotics: off 11-22  If drug rxn, would consider half life of  Drug (~ 24 for ceftriaxone) He may have further fever yet.   Clinically he looks well.  Will be available on 08-22-18      Bobby Rumpf MD, FACP Infectious Diseases (pager) [phone] [email or web address] 08/21/2018, 1:50 PM  LOS: 4 days

## 2018-08-22 LAB — BASIC METABOLIC PANEL
Anion gap: 7 (ref 5–15)
BUN: 24 mg/dL — ABNORMAL HIGH (ref 8–23)
CALCIUM: 8 mg/dL — AB (ref 8.9–10.3)
CO2: 21 mmol/L — ABNORMAL LOW (ref 22–32)
CREATININE: 1.28 mg/dL — AB (ref 0.61–1.24)
Chloride: 107 mmol/L (ref 98–111)
GFR calc Af Amer: >60 mL/min (ref >60)
GFR calc non Af Amer: 54 mL/min — ABNORMAL LOW (ref >60)
Glucose, Bld: 113 mg/dL — ABNORMAL HIGH (ref 70–99)
Potassium: 4 mmol/L (ref 3.5–5.1)
SODIUM: 135 mmol/L (ref 135–145)

## 2018-08-22 LAB — CULTURE, BLOOD (ROUTINE X 2)
CULTURE: NO GROWTH
Culture: NO GROWTH
SPECIAL REQUESTS: ADEQUATE
SPECIAL REQUESTS: ADEQUATE

## 2018-08-22 LAB — CBC
HCT: 35.4 % — ABNORMAL LOW (ref 39.0–52.0)
Hemoglobin: 11.9 g/dL — ABNORMAL LOW (ref 13.0–17.0)
MCH: 30.4 pg (ref 26.0–34.0)
MCHC: 33.6 g/dL (ref 30.0–36.0)
MCV: 90.3 fL (ref 80.0–100.0)
Platelets: 152 K/uL (ref 150–400)
RBC: 3.92 MIL/uL — ABNORMAL LOW (ref 4.22–5.81)
RDW: 13.2 % (ref 11.5–15.5)
WBC: 5 K/uL (ref 4.0–10.5)
nRBC: 0 % (ref 0.0–0.2)

## 2018-08-22 MED ORDER — ACETAMINOPHEN 325 MG PO TABS: 650.0000 mg | ORAL_TABLET | Freq: Four times a day (QID) | ORAL | Status: AC | PRN

## 2018-08-22 MED ORDER — METOPROLOL TARTRATE 50 MG PO TABS: 50.0000 mg | ORAL_TABLET | Freq: Two times a day (BID) | ORAL | 0 refills | Status: DC

## 2018-08-22 MED ORDER — FLECAINIDE ACETATE 150 MG PO TABS: 75.0000 mg | ORAL_TABLET | Freq: Two times a day (BID) | ORAL | 0 refills | Status: DC

## 2018-08-22 MED ORDER — PANTOPRAZOLE SODIUM 40 MG PO TBEC: 40.0000 mg | DELAYED_RELEASE_TABLET | Freq: Every day | ORAL | 0 refills | Status: DC

## 2018-08-22 MED ORDER — RIVAROXABAN 20 MG PO TABS: 20.0000 mg | ORAL_TABLET | Freq: Every day | ORAL | 0 refills | Status: DC

## 2018-08-22 NOTE — Progress Notes (Signed)
Progress Note  Patient Name: Ricky Singh Date of Encounter: 08/22/2018  Primary Cardiologist: Dorris Carnes, MD   Subjective   No chest pain or sob. No fever or chills.   Inpatient Medications    Scheduled Meds: . brimonidine  1 drop Right Eye BID  . digoxin  0.5 mg Intravenous Once  . flecainide  75 mg Oral Q12H  . folic acid  1 mg Oral Daily  . gabapentin  300 mg Oral BID  . latanoprost  1 drop Both Eyes QHS  . metoprolol tartrate  50 mg Oral BID  . multivitamin  1 tablet Oral Daily  . multivitamin with minerals  1 tablet Oral Daily  . rivaroxaban  20 mg Oral Q supper  . sodium chloride flush  3 mL Intravenous Q12H  . thiamine  100 mg Oral Daily   Or  . thiamine  100 mg Intravenous Daily   Continuous Infusions:  PRN Meds: acetaminophen **OR** acetaminophen, albuterol   Vital Signs    Vitals:   08/21/18 1143 08/21/18 1610 08/21/18 2035 08/22/18 0516  BP: 94/74  115/70 126/80  Pulse: 73  72 63  Resp: 19     Temp: 99.7 F (37.6 C) 98.8 F (37.1 C) 99.5 F (37.5 C) 98.3 F (36.8 C)  TempSrc: Oral Oral Oral Oral  SpO2:   95% 98%  Weight:    97.9 kg  Height:        Intake/Output Summary (Last 24 hours) at 08/22/2018 1043 Last data filed at 08/21/2018 1700 Gross per 24 hour  Intake 480 ml  Output 500 ml  Net -20 ml   Filed Weights   08/19/18 0654 08/21/18 0456 08/22/18 0516  Weight: 101.9 kg 97.9 kg 97.9 kg    Telemetry    nsr - Personally Reviewed  ECG    nsr - Personally Reviewed  Physical Exam   GEN: No acute distress.   Neck: No JVD Cardiac: RRR, no murmurs, rubs, or gallops.  Respiratory: Clear to auscultation bilaterally. GI: Soft, nontender, non-distended  MS: No edema; No deformity. Neuro:  Nonfocal  Psych: Normal affect   Labs    Chemistry Recent Labs  Lab 08/17/18 2049  08/20/18 0426 08/21/18 0446 08/22/18 0308  NA  --    < > 134* 136 135  K  --    < > 3.9 3.8 4.0  CL  --    < > 106 105 107  CO2  --    < > 21* 21*  21*  GLUCOSE  --    < > 111* 113* 113*  BUN  --    < > 19 20 24*  CREATININE  --    < > 1.46* 1.48* 1.28*  CALCIUM  --    < > 7.9* 8.1* 8.0*  PROT 5.8*  --   --  6.0*  --   ALBUMIN 2.7*  --   --  2.7*  --   AST 20  --   --  26  --   ALT 20  --   --  31  --   ALKPHOS 45  --   --  53  --   BILITOT 0.5  --   --  0.8  --   GFRNONAA  --    < > 46* 45* 54*  GFRAA  --    < > 53* 52* >60  ANIONGAP  --    < > 7 10 7    < > =  values in this interval not displayed.     Hematology Recent Labs  Lab 08/20/18 0426 08/21/18 0446 08/22/18 0308  WBC 4.7 4.7 5.0  RBC 3.90* 4.24 3.92*  HGB 11.9* 12.8* 11.9*  HCT 35.1* 38.8* 35.4*  MCV 90.0 91.5 90.3  MCH 30.5 30.2 30.4  MCHC 33.9 33.0 33.6  RDW 13.0 13.2 13.2  PLT 116* 154 152    Cardiac Enzymes Recent Labs  Lab 08/19/18 1822  TROPONINI <0.03    Recent Labs  Lab 08/17/18 1343  TROPIPOC 0.03     BNPNo results for input(s): BNP, PROBNP in the last 168 hours.   DDimer No results for input(s): DDIMER in the last 168 hours.   Radiology    No results found.  Cardiac Studies   none  Patient Profile     72 y.o. male admitted with acute febrile illness who developed atrial fib with a RVR, started on flecainide and beta blocker and reverted back to NSR. He has had no fever or chills and is maintaining NSR.  Assessment & Plan    1. Persistent atrial fib - he is maintaining NSR on flecainide and metoprolol. DC home on these meds. 2. Coags - he has had no bleeding on xarelto 20 mg daily. Because he does not feel his atrial fib, he will likely need to stay on this medication long term. 3. Strep A infection - he is off anti-biotics without recurrent fever.  4. Disp. - he can be discharged home. We will schedule followup with Dr. Harrington Challenger.  [facility] will sign off.   Medication Recommendations: flecainide 75 mg twice daily and metoprolol 50 mg twice daily, and xarelto 20 mg daily with food  Other recommendations (labs, testing,  etc):  ECG in our office in 2 weeks (nurse visit) Follow up as an outpatient:  followup Dr. Dorris Carnes in 4 weeks.   For questions or updates, please contact [facility] Please consult [email or web address] for contact info under Cardiology/STEMI.      Signed, [name], MD  08/22/2018, 10:43 AM  Patient ID: Ricky Singh, male   DOB: 1946-04-04, 72 y.o.   MRN: [id number]

## 2018-08-22 NOTE — Discharge Summary (Signed)
Ricky Singh, is a 72 y.o. male  DOB 10/19/1945  MRN [id number].  Admission date:  08/17/2018  Admitting Physician  Ricky Jansky, MD  Discharge Date:  08/22/2018   Primary MD  No primary care provider on file.  Recommendations for primary care physician for things to follow:  -Please check CBC, BMP during next visit, and to follow with cardiology in 4 weeks, and A. fib clinic on scheduled appointments. -Patient will need repeat CT chest in 3 to 89-month, and another CT chest in 12 to 51-month to follow-up on incidental finding of pulmonary nodules, mediastinal lymphadenopathy.    Admission Diagnosis  Scarlet fever [A38.9] Strep throat [J02.0] Strep pharyngitis with scarlet fever [J02.0, A38.8] Community acquired pneumonia, unspecified laterality [J18.9] Sepsis ([place]) [A41.9]   Discharge Diagnosis  Scarlet fever [A38.9] Strep throat [J02.0] Strep pharyngitis with scarlet fever [J02.0, A38.8] Community acquired pneumonia, unspecified laterality [J18.9] Sepsis ([place]) [A41.9]    Principal Problem:   Fever Active Problems:   Sepsis ([place])   Strep pharyngitis with scarlet fever   Atrial fibrillation with RVR ([place])   CKD (chronic kidney disease), stage III ([place])      Past Medical History:  Diagnosis Date  . Allergy   . Ankle fracture, left   . Arthritis   . Cataract   . Chronic kidney disease (CKD), stage III (moderate) (H[facility])   . Femur fracture, right ([place])   . Glaucoma   . Hypertension   . Macular degeneration   . Scoliosis     Past Surgical History:  Procedure Laterality Date  . COLONOSCOPY    . DENTAL SURGERY    . [place]  . TONSILLECTOMY  1951   or 1952       History of present illness and  Hospital Course:     Kindly Ricky Singh H&P for history of present illness and admission details, please review complete Labs, Consult reports and Test reports for all  details in brief  HPI  from the history and physical done on the day of admission 08/17/2018  HPI: Ricky Singh is a pleasant 72 year old married male, independent, PMH of HTN, stage III chronic kidney disease, chronic low back pain, glaucoma, presented to [facility] ED via PCPs office for complaints of fever and chills, diagnosed with strep throat at PCPs office and noted to have asymptomatic A. fib with RVR on EKG done in the office.  Patient and spouse at bedside provided history.  He was in usual state of health until 11/16 when he started experiencing chills and feverishness.  He did not check his temperature.  Apart from chronic intermittent stuffiness of his ears, he denied any other complaints.  He denied headache, earache, sore throat, difficulty swallowing, runny nose, watery eyes, cough, dyspnea, chest pain, palpitations, dizziness, lightheadedness, nausea, vomiting, diarrhea or urinary symptoms.  No known exposure to sick contacts.  Denied noticing skin rash at home.  Received flu shot in September of this year and pneumonia shot couple years ago.  He took Tylenol and his fevers would break with sweats only to return.  Since he was not improving, he decided to go to PCPs office where throat swab tested positive for strep throat but randomly performed EKG showed A. fib with RVR.  Patient was then instructed to come to the ED for further evaluation.  Patient states that it is not unusual for him to have a strep throat without pain or other symptoms.  He clearly indicated that rash was noted in the ED prior to administration of several antibiotics.  ED Course: Patient noted to be febrile 102.9 F, tachypneic 25 bpm, tachycardic 105/min.  Lab work showed platelets of 121, creatinine of 1.59 with unknown baseline, negative urine microscopy, normal lactate, chest x-ray showing COPD and patchy density in the right upper lobe which may reflect the first costochondral junction but a patchy infiltrate or mass is  not excluded and repeat chest x-ray versus CT chest was recommended for further evaluation.  He received a dose of IM benzathine penicillin.  Per EDP report, he subsequently became hypotensive with SBP in the 80s and was bolused with IV fluids and then received IV ceftriaxone, azithromycin and clindamycin for suspected toxic shock syndrome.  Currently patient feels much better and denies complaints.   Hospital Course    Fever of unknown origin / rash -ID input greatly appreciated, possibly due to strep infection initially, but then fever most likely son sustained it due to antibiotics, cultures are negative, viral panel is been negative as well, he was seen by ID, he was monitored off antibiotics as he is nontoxic-appearing, patient with no fever of the last 24 hours.  New paroxysmal Afib with RvR  CHADSvASC score 2, started on Xarelto Management per cardiology, he started on flecainide and metoprolol, started to normal sinus rhythm yesterday, he maintained normal sinus rhythm, to follow with cardiology as an outpatient   Recent Group A strep infection -treated  Pulmonary nodules As well with some mediastinal lymphadenopathy, most likely in the setting of acute bronchitis, will need repeat CT chest in 3 to 60-month, and another CT chest in 12 to 43-month, please [place] reading and recommendation below from radiology . -Patient and wife made aware   CKD3 Appears to be at his baseline Avoid nephrotoxic agents  Chronic normocytic anemia No sign of overt bleeding Hg stable  Alcohol use disorder  No evidence of withdrawals during hospital stay  Chronic thrombocytopenia Platelet count was 152K on discharge     Discharge Condition:  Stable   Follow UP  Follow-up Information    Ricky Needs, NP Follow up on 09/01/2018.   Specialties:  Nurse Practitioner, Cardiology Why:  Please arrive 15 minutes early for your 2:30pm post-hospital appointment at the atrial fibrillation  clinic. Please call the number above for directions to the clinic. The parking code is 1900 Contact information: [place] [address] [phone]        Ricky Battles, MD Follow up in 1 week(s).   Specialty:  Internal Medicine Contact information: [place] [address] [phone]        Fay Records, MD Follow up in 4 week(s).   Specialty:  Cardiology Contact information: [place] [address] [phone]             Discharge Instructions  and  Discharge Medications     Discharge Instructions    Discharge instructions   Complete by:  As directed  Follow with Primary MD  in 7 days   Get CBC, CMP, 2 view Chest X ray checked  by Primary MD next visit.    Activity: As tolerated with Full fall precautions use walker/cane & assistance as needed   Disposition Home    Diet: Heart Healthy ** , with feeding assistance and aspiration precautions.  For Heart failure patients - Check your Weight same time everyday, if you gain over 2 pounds, or you develop in leg swelling, experience more shortness of breath or chest pain, call your Primary MD immediately. Follow Cardiac Low Salt Diet and 1.5 lit/day fluid restriction.   On your next visit with your primary care physician please Get Medicines reviewed and adjusted.   Please request your Prim.MD to go over all Hospital Tests and Procedure/Radiological results at the follow up, please get all Hospital records sent to your Prim MD by signing hospital release before you go home.   If you experience worsening of your admission symptoms, develop shortness of breath, life threatening emergency, suicidal or homicidal thoughts you must seek medical attention immediately by calling 911 or calling your MD immediately  if symptoms less severe.  You Must read complete instructions/literature along with all the possible adverse reactions/side effects for all  the Medicines you take and that have been prescribed to you. Take any new Medicines after you have completely understood and accpet all the possible adverse reactions/side effects.   Do not drive, operating heavy machinery, perform activities at heights, swimming or participation in water activities or provide baby sitting services if your were admitted for syncope or siezures until you have seen by Primary MD or a Neurologist and advised to do so again.  Do not drive when taking Pain medications.    Do not take more than prescribed Pain, Sleep and Anxiety Medications  Special Instructions: If you have smoked or chewed Tobacco  in the last 2 yrs please stop smoking, stop any regular Alcohol  and or any Recreational drug use.  Wear Seat belts while driving.   Please note  You were cared for by a hospitalist during your hospital stay. If you have any questions about your discharge medications or the care you received while you were in the hospital after you are discharged, you can call the unit and asked to speak with the hospitalist on call if the hospitalist that took care of you is not available. Once you are discharged, your primary care physician will handle any further medical issues. Please note that NO REFILLS for any discharge medications will be authorized once you are discharged, as it is imperative that you return to your primary care physician (or establish a relationship with a primary care physician if you do not have one) for your aftercare Singh so that they can reassess your need for medications and monitor your lab values.   Increase activity slowly   Complete by:  As directed      Allergies as of 08/22/2018   No Known Allergies     Medication List    STOP taking these medications   aspirin 81 MG tablet   doxazosin 4 MG tablet Commonly known as:  CARDURA   losartan 100 MG tablet Commonly known as:  COZAAR   sulindac 150 MG tablet Commonly known as:  CLINORIL      TAKE these medications   acetaminophen 325 MG tablet Commonly known as:  TYLENOL Take 2 tablets (650 mg total) by mouth every 6 (six) hours as  needed for mild pain (or Fever >/= 101).   brimonidine 0.15 % ophthalmic solution Commonly known as:  ALPHAGAN Place 1 drop into the right eye 2 (two) times daily.   Fish Oil 1200 MG Caps Take 1 capsule by mouth 2 (two) times daily.   flecainide 150 MG tablet Commonly known as:  TAMBOCOR Take 0.5 tablets (75 mg total) by mouth every 12 (twelve) hours.   gabapentin 300 MG capsule Commonly known as:  NEURONTIN Take 300 mg by mouth 2 (two) times daily.   latanoprost 0.005 % ophthalmic solution Commonly known as:  XALATAN Place 1 drop into both eyes daily.   metoprolol tartrate 50 MG tablet Commonly known as:  LOPRESSOR Take 1 tablet (50 mg total) by mouth 2 (two) times daily.   pantoprazole 40 MG tablet Commonly known as:  PROTONIX Take 1 tablet (40 mg total) by mouth daily.   PRESERVISION AREDS 2 Caps Take 1 capsule by mouth daily.   rivaroxaban 20 MG Tabs tablet Commonly known as:  XARELTO Take 1 tablet (20 mg total) by mouth daily with supper.   vardenafil 20 MG tablet Commonly known as:  LEVITRA Take 20 mg by mouth daily as needed for erectile dysfunction.         Diet and Activity recommendation: [place] Discharge Instructions above   Consults obtained -  ID Cardiology   Major procedures and Radiology Reports - PLEASE review detailed and final reports for all details, in brief -      Dg Chest 1 View  Result Date: 08/18/2018 CLINICAL DATA:  Patchy density right upper lobe EXAM: CHEST  1 VIEW COMPARISON:  08/17/2018 FINDINGS: Apical lordotic image demonstrates persistent density in the right apex. This is concerning for apical nodule. Scarring in the lung bases. Heart is borderline in size. Mild hyperinflation. No visible effusions. IMPRESSION: Apical lordotic image demonstrates persistent density in the right apex  concerning for pulmonary nodule. Recommend chest CT for further evaluation. COPD. Electronically Signed   By: Rolm Baptise M.D.   On: 08/18/2018 09:59   Ct Chest Wo Contrast  Result Date: 08/18/2018 CLINICAL DATA:  Shortness of breath.  Follow up pulmonary nodule. EXAM: CT CHEST WITHOUT CONTRAST TECHNIQUE: Multidetector CT imaging of the chest was performed following the standard protocol without IV contrast. COMPARISON:  Chest radiograph August 18, 2018 FINDINGS: CARDIOVASCULAR: Heart size is normal. Minimal pericardial effusion. Thoracic aorta is normal course and caliber, unremarkable. MEDIASTINUM/NODES: No mediastinal mass. Mediastinal lymphadenopathy measuring to 16 mm short access. Suspected RIGHT and potentially LEFT hilar lymphadenopathy, decreased sensitivity without intravenous contrast. LUNGS/PLEURA: Tracheobronchial tree is patent, no pneumothorax. Mild bronchial wall thickening. Multiple prominent RIGHT-sided solid, sub solid and ground-glass nodules measuring to 8 mm RIGHT middle lobe (series 8, image 124). No RIGHT apical lung nodule, prominent first rib corresponding to known nodule on prior radiograph. Small RIGHT pleural effusion. RIGHT lower lobes medial scarring and bronchiectasis. UPPER ABDOMEN: Nonacute. MUSCULOSKELETAL: Nonacute. Broad dextroscoliosis. Old mild T8 compression fracture. IMPRESSION: 1. Mild bronchial wall thickening seen with bronchitis or reactive airway disease. Small RIGHT pleural effusion. No pneumonia. 2. Mild mediastinal lymphadenopathy may be reactive though, recommend close attention on follow-up CT. 3. **An incidental finding of potential clinical significance has been found. Multiple small pulmonary nodules. Non-contrast chest CT at 3-6 months is recommended. If the nodules are stable at time of repeat CT, then future CT at 18-24 months (from today's scan) is considered optional for low-risk patients, but is recommended for high-risk patients. This  recommendation follows the consensus statement: Guidelines for Management of Incidental Pulmonary Nodules Detected on CT Images: From the Fleischner Society 2017; Radiology 2017; 284:228-243.** Electronically Signed   By: Elon Alas M.D.   On: 08/18/2018 21:15   Dg Chest Port 1 View  Result Date: 08/17/2018 CLINICAL DATA:  Chills and shortness of breath for the past 3 days. Remote history of smoking. EXAM: PORTABLE CHEST 1 VIEW COMPARISON:  None in PACs FINDINGS: The lungs are well-expanded. The interstitial markings are coarse. There is patchy density in the right upper lobe that may be related to the costochondral junction of the first rib. The heart and pulmonary vascularity are normal. There is no pleural effusion. IMPRESSION: COPD. Patchy density in the right upper lobe may reflect the first costochondral junction but a parenchymal infiltrate or mass is not excluded. At minimum an apical lordotic chest x-ray is recommended. Chest CT scanning may be ultimately needed to exclude occult malignancy. Electronically Signed   By: David  Martinique M.D.   On: 08/17/2018 13:50    Micro Results    Recent Results (from the past 240 hour(s))  Blood culture (routine x 2)     Status: None (Preliminary result)   Collection Time: 08/17/18  1:45 PM  Result Value Ref Range Status   Specimen Description BLOOD RIGHT ARM  Final   Special Requests   Final    BOTTLES DRAWN AEROBIC AND ANAEROBIC Blood Culture adequate volume   Culture   Final    NO GROWTH 4 DAYS Performed at [facility], [address], [place], [place] [address]    Report Status PENDING  Incomplete  Urine culture     Status: None   Collection Time: 08/17/18  4:08 PM  Result Value Ref Range Status   Specimen Description URINE, CLEAN CATCH  Final   Special Requests NONE  Final   Culture   Final    NO GROWTH Performed at [facility], [place] [address], [place], [place] [address]    Report Status 08/18/2018 FINAL  Final    Blood culture (routine x 2)     Status: None (Preliminary result)   Collection Time: 08/17/18  4:30 PM  Result Value Ref Range Status   Specimen Description SITE NOT SPECIFIED  Final   Special Requests   Final    BOTTLES DRAWN AEROBIC AND ANAEROBIC Blood Culture adequate volume   Culture   Final    NO GROWTH 4 DAYS Performed at [facility], [address] [place], [place] [address]    Report Status PENDING  Incomplete  Culture, blood (Routine X 2) w Reflex to ID Panel     Status: None (Preliminary result)   Collection Time: 08/18/18  4:50 PM  Result Value Ref Range Status   Specimen Description BLOOD LEFT HAND  Final   Special Requests   Final    BOTTLES DRAWN AEROBIC ONLY Blood Culture adequate volume   Culture   Final    NO GROWTH 3 DAYS Performed at [facility], [place] [address] [place], [place] [address]    Report Status PENDING  Incomplete  Respiratory Panel by PCR     Status: None   Collection Time: 08/18/18  7:03 PM  Result Value Ref Range Status   Adenovirus NOT DETECTED NOT DETECTED Final   Coronavirus 229E NOT DETECTED NOT DETECTED Final   Coronavirus HKU1 NOT DETECTED NOT DETECTED Final   Coronavirus NL63 NOT DETECTED NOT DETECTED Final  Coronavirus OC43 NOT DETECTED NOT DETECTED Final   Metapneumovirus NOT DETECTED NOT DETECTED Final   Rhinovirus / Enterovirus NOT DETECTED NOT DETECTED Final   Influenza A NOT DETECTED NOT DETECTED Final   Influenza B NOT DETECTED NOT DETECTED Final   Parainfluenza Virus 1 NOT DETECTED NOT DETECTED Final   Parainfluenza Virus 2 NOT DETECTED NOT DETECTED Final   Parainfluenza Virus 3 NOT DETECTED NOT DETECTED Final   Parainfluenza Virus 4 NOT DETECTED NOT DETECTED Final   Respiratory Syncytial Virus NOT DETECTED NOT DETECTED Final   Bordetella pertussis NOT DETECTED NOT DETECTED Final   Chlamydophila pneumoniae NOT DETECTED NOT DETECTED Final   Mycoplasma pneumoniae NOT DETECTED NOT DETECTED Final    Comment:  Performed at [facility], [place] [address], [place], [place] [address]  Culture, blood (routine x 2)     Status: None (Preliminary result)   Collection Time: 08/20/18  4:00 AM  Result Value Ref Range Status   Specimen Description BLOOD LEFT ARM  Final   Special Requests   Final    BOTTLES DRAWN AEROBIC ONLY Blood Culture adequate volume   Culture   Final    NO GROWTH 1 DAY Performed at [facility], [place] [address] [place], [place] [address]    Report Status PENDING  Incomplete  Culture, blood (routine x 2)     Status: None (Preliminary result)   Collection Time: 08/20/18  4:20 AM  Result Value Ref Range Status   Specimen Description BLOOD RIGHT HAND  Final   Special Requests   Final    BOTTLES DRAWN AEROBIC ONLY Blood Culture adequate volume   Culture   Final    NO GROWTH 1 DAY Performed at [facility], [place] [address], [place], [place] [address]    Report Status PENDING  Incomplete       Today   Subjective:   Frederico Valdez today has no headache,no chest abdominal pain,no new weakness tingling or numbness, feels much better wants to go home today.  Objective:   Blood pressure 126/80, pulse 63, temperature 98.3 F (36.8 C), temperature source Oral, resp. rate 19, height 6\' 3"  (1.905 m), weight 97.9 kg, SpO2 98 %.   Intake/Output Summary (Last 24 hours) at 08/22/2018 1115 Last data filed at 08/21/2018 1700 Gross per 24 hour  Intake 480 ml  Output 500 ml  Net -20 ml    Exam Awake Alert, Oriented x 3, No new F.N deficits, Normal affect Symmetrical Chest wall movement, Good air movement bilaterally, CTAB RRR,No Gallops,Rubs or new Murmurs, No Parasternal Heave +ve B.Sounds, Abd Soft, Non tender, No organomegaly appriciated, No rebound -guarding or rigidity. No Cyanosis, Clubbing or edema, rash almost resolved Data Review   CBC w Diff:  Lab Results  Component Value Date   WBC 5.0 08/22/2018   HGB 11.9 (L) 08/22/2018   HCT 35.4 (L) 08/22/2018    PLT 152 08/22/2018   LYMPHOPCT 11 08/21/2018   MONOPCT 8 08/21/2018   EOSPCT 7 08/21/2018   BASOPCT 0 08/21/2018    CMP:  Lab Results  Component Value Date   NA 135 08/22/2018   K 4.0 08/22/2018   CL 107 08/22/2018   CO2 21 (L) 08/22/2018   BUN 24 (H) 08/22/2018   CREATININE 1.28 (H) 08/22/2018   PROT 6.0 (L) 08/21/2018   ALBUMIN 2.7 (L) 08/21/2018   BILITOT 0.8 08/21/2018   ALKPHOS 53 08/21/2018   AST 26 08/21/2018   ALT 31 08/21/2018  .  Total Time in preparing paper work, data evaluation and todays exam - 27 minutes  Phillips Climes M.D on 08/22/2018 at 11:15 AM  [facility]   Office  [phone]

## 2018-08-23 LAB — CULTURE, BLOOD (ROUTINE X 2)
CULTURE: NO GROWTH
SPECIAL REQUESTS: ADEQUATE

## 2018-08-23 NOTE — Consult Note (Signed)
            [facility] CM Primary Care Navigator  08/23/2018  Ricky Singh July 21, 1946 [id number]   Attempt to seepatient at the bedside to identify possible discharge needs buthe wasalready discharged homeover the weekend.  Per MD note, patient presented for further evaluation to [facility] via PCP's office (primary care provider) for complaints of fever and chills, diagnosed with strep throat at primary care provider's office and noted to have asymptomatic atrial fibrillation with rapid ventricular response. (strep throat, strep pharyngitis with scarlet fever, community acquired pneumonia, sepsis, paroxysmal a-fib)  Patient has discharge instruction to follow-up withDr. Leanna Battles (primary care provider- confirmed with office) in 1 week, A-fib Clinic follow-up on 09/01/18 and cardiology follow-up in 4 weeks.  Primary care provider's office is listed as providing transition of care (TOC) follow-up.    For additional questions please contact:  Edwena Felty A. Jaydyn Menon, BSN, RN-BC [facility] PRIMARY CARE Navigator Cell: [phone]

## 2018-08-25 LAB — CULTURE, BLOOD (ROUTINE X 2)
CULTURE: NO GROWTH
Culture: NO GROWTH
SPECIAL REQUESTS: ADEQUATE
SPECIAL REQUESTS: ADEQUATE

## 2018-08-31 DIAGNOSIS — Z6827 Body mass index (BMI) 27.0-27.9, adult: Secondary | ICD-10-CM | POA: Diagnosis not present

## 2018-08-31 DIAGNOSIS — I4891 Unspecified atrial fibrillation: Secondary | ICD-10-CM | POA: Diagnosis not present

## 2018-08-31 DIAGNOSIS — M545 Low back pain: Secondary | ICD-10-CM | POA: Diagnosis not present

## 2018-08-31 DIAGNOSIS — I10 Essential (primary) hypertension: Secondary | ICD-10-CM | POA: Diagnosis not present

## 2018-08-31 DIAGNOSIS — A400 Sepsis due to streptococcus, group A: Secondary | ICD-10-CM | POA: Diagnosis not present

## 2018-08-31 DIAGNOSIS — N183 Chronic kidney disease, stage 3 (moderate): Secondary | ICD-10-CM | POA: Diagnosis not present

## 2018-09-01 ENCOUNTER — Ambulatory Visit (HOSPITAL_COMMUNITY)
Admission: RE | Admit: 2018-09-01 | Discharge: 2018-09-01 | Disposition: A | Discharge status: Home / Self Care | Payer: Medicare Other | Source: Ambulatory Visit | Attending: Nurse Practitioner | Admitting: Nurse Practitioner

## 2018-09-01 ENCOUNTER — Encounter (HOSPITAL_COMMUNITY): Payer: Self-pay | Admitting: Nurse Practitioner

## 2018-09-01 VITALS — BP 126/72 | HR 52 | Ht 75.0 in | Wt 213.0 lb

## 2018-09-01 DIAGNOSIS — N183 Chronic kidney disease, stage 3 (moderate): Secondary | ICD-10-CM | POA: Diagnosis not present

## 2018-09-01 DIAGNOSIS — Z8249 Family history of ischemic heart disease and other diseases of the circulatory system: Secondary | ICD-10-CM | POA: Diagnosis not present

## 2018-09-01 DIAGNOSIS — I4891 Unspecified atrial fibrillation: Secondary | ICD-10-CM | POA: Diagnosis not present

## 2018-09-01 DIAGNOSIS — I129 Hypertensive chronic kidney disease with stage 1 through stage 4 chronic kidney disease, or unspecified chronic kidney disease: Secondary | ICD-10-CM | POA: Insufficient documentation

## 2018-09-01 DIAGNOSIS — Z79899 Other long term (current) drug therapy: Secondary | ICD-10-CM | POA: Diagnosis not present

## 2018-09-01 DIAGNOSIS — M1990 Unspecified osteoarthritis, unspecified site: Secondary | ICD-10-CM | POA: Diagnosis not present

## 2018-09-01 DIAGNOSIS — M419 Scoliosis, unspecified: Secondary | ICD-10-CM | POA: Insufficient documentation

## 2018-09-01 DIAGNOSIS — Z7901 Long term (current) use of anticoagulants: Secondary | ICD-10-CM | POA: Insufficient documentation

## 2018-09-01 DIAGNOSIS — H409 Unspecified glaucoma: Secondary | ICD-10-CM | POA: Diagnosis not present

## 2018-09-01 DIAGNOSIS — Z87891 Personal history of nicotine dependence: Secondary | ICD-10-CM | POA: Diagnosis not present

## 2018-09-01 LAB — BASIC METABOLIC PANEL
ANION GAP: 8 (ref 5–15)
BUN: 19 mg/dL (ref 8–23)
CALCIUM: 9.2 mg/dL (ref 8.9–10.3)
CO2: 24 mmol/L (ref 22–32)
CREATININE: 1.35 mg/dL — AB (ref 0.61–1.24)
Chloride: 106 mmol/L (ref 98–111)
GFR calc Af Amer: 60 mL/min — ABNORMAL LOW (ref >60)
GFR, EST NON AFRICAN AMERICAN: 52 mL/min — AB (ref >60)
GLUCOSE: 93 mg/dL (ref 70–99)
Potassium: 4.6 mmol/L (ref 3.5–5.1)
Sodium: 138 mmol/L (ref 135–145)

## 2018-09-01 LAB — CBC
HCT: 41.1 % (ref 39.0–52.0)
Hemoglobin: 13.1 g/dL (ref 13.0–17.0)
MCH: 29.6 pg (ref 26.0–34.0)
MCHC: 31.9 g/dL (ref 30.0–36.0)
MCV: 93 fL (ref 80.0–100.0)
PLATELETS: 322 10*3/uL (ref 150–400)
RBC: 4.42 MIL/uL (ref 4.22–5.81)
RDW: 12.8 % (ref 11.5–15.5)
WBC: 4.5 10*3/uL (ref 4.0–10.5)
nRBC: 0 % (ref 0.0–0.2)

## 2018-09-01 MED ORDER — RIVAROXABAN 20 MG PO TABS: 20.0000 mg | ORAL_TABLET | Freq: Every day | ORAL | 2 refills | Status: DC

## 2018-09-01 MED ORDER — FLECAINIDE ACETATE 150 MG PO TABS: 75.0000 mg | ORAL_TABLET | Freq: Two times a day (BID) | ORAL | 2 refills | Status: DC

## 2018-09-01 MED ORDER — METOPROLOL TARTRATE 50 MG PO TABS: 50.0000 mg | ORAL_TABLET | Freq: Two times a day (BID) | ORAL | 2 refills | Status: DC

## 2018-09-02 NOTE — Progress Notes (Signed)
Primary Care Physician: No primary care provider on file. Referring Physician: [facility] f/u Cardiologist: Dr. Beverlyn Roux [name] is a 72 y.o. male with a h/o HTN, CKD,  presented to [facility] ED via PCPs office for complaints of fever and chills, diagnosed with strep throat at PCPs office and noted to have asymptomatic A. fib with RVR on EKG done in the office.  He was in usual state of health until 11/16 when he started experiencing chills and feverishness.Since he was not improving, he decided to go to PCPs office where throat swab tested positive for strep throat but randomly performed EKG showed A. fib with RVR. Patient was then instructed to come to the ED for further evaluation. he is now in the afib clinic for f/u.  He was treated with antibiotics. Per EDP report, he subsequently became hypotensive with SBP in the 80s and was bolused with IV fluids and then received IV ceftriaxone, azithromycin and clindamycin for suspected toxic shock syndrome with improvement in fever and condition.   For afib, he was started on xarelto for chadsvasc score of 2. He was also started on flecainide and BB. He returned to SR prior to discharge.   In the office today, he feels improved. He remains in SR. He is tolerating new drugs. He had f/u with PCP yesterday.  Today, he denies symptoms of palpitations, chest pain, shortness of breath, orthopnea, PND, lower extremity edema, dizziness, presyncope, syncope, or neurologic sequela. The patient is tolerating medications without difficulties and is otherwise without complaint today.   Past Medical History:  Diagnosis Date  . Allergy   . Ankle fracture, left   . Arthritis   . Cataract   . Chronic kidney disease (CKD), stage III (moderate) (HCC)   . Femur fracture, right ([place])   . Glaucoma   . Hypertension   . Macular degeneration   . Scoliosis    Past Surgical History:  Procedure Laterality Date  . COLONOSCOPY    . DENTAL SURGERY    . [place]  . TONSILLECTOMY  1951   or 1952    Current Outpatient Medications  Medication Sig Dispense Refill  . acetaminophen (TYLENOL) 325 MG tablet Take 2 tablets (650 mg total) by mouth every 6 (six) hours as needed for mild pain (or Fever >/= 101).    . brimonidine (ALPHAGAN) 0.15 % ophthalmic solution Place 1 drop into the right eye 2 (two) times daily.  11  . flecainide (TAMBOCOR) 150 MG tablet Take 0.5 tablets (75 mg total) by mouth every 12 (twelve) hours. 90 tablet 2  . gabapentin (NEURONTIN) 300 MG capsule Take 300 mg by mouth 2 (two) times daily.     Marland Kitchen latanoprost (XALATAN) 0.005 % ophthalmic solution Place 1 drop into both eyes daily.    . metoprolol tartrate (LOPRESSOR) 50 MG tablet Take 1 tablet (50 mg total) by mouth 2 (two) times daily. 180 tablet 2  . Multiple Vitamins-Minerals (PRESERVISION AREDS 2) CAPS Take 1 capsule by mouth daily.    . Omega-3 Fatty Acids (FISH OIL) 1200 MG CAPS Take 1 capsule by mouth 2 (two) times daily.     . pantoprazole (PROTONIX) 40 MG tablet Take 1 tablet (40 mg total) by mouth daily. 30 tablet 0  . rivaroxaban (XARELTO) 20 MG TABS tablet Take 1 tablet (20 mg total) by mouth daily with supper. 90 tablet 2   No current facility-administered medications for this encounter.     No  Known Allergies  Social History   Socioeconomic History  . Marital status: Married    Spouse name: Not on file  . Number of children: Not on file  . Years of education: Not on file  . Highest education level: Not on file  Occupational History  . Not on file  Social Needs  . Financial resource strain: Not on file  . Food insecurity:    Worry: Not on file    Inability: Not on file  . Transportation needs:    Medical: Not on file    Non-medical: Not on file  Tobacco Use  . Smoking status: Former Research scientist (life sciences)  . Smokeless tobacco: Never Used  Substance and Sexual Activity  . Alcohol use: Yes    Alcohol/week: 14.0 standard drinks    Types: 14 Cans of beer  per week    Comment: 2 beers nightly  . Drug use: No  . Sexual activity: Not on file  Lifestyle  . Physical activity:    Days per week: Not on file    Minutes per session: Not on file  . Stress: Not on file  Relationships  . Social connections:    Talks on phone: Not on file    Gets together: Not on file    Attends religious service: Not on file    Active member of club or organization: Not on file    Attends meetings of clubs or organizations: Not on file    Relationship status: Not on file  . Intimate partner violence:    Fear of current or ex partner: Not on file    Emotionally abused: Not on file    Physically abused: Not on file    Forced sexual activity: Not on file  Other Topics Concern  . Not on file  Social History Narrative  . Not on file    Family History  Problem Relation Age of Onset  . Lymphoma Father 25       started in testicles  . Hypertension Mother   . Angina Maternal Grandfather   . Kidney cancer Cousin   . Colon cancer Neg Hx   . Esophageal cancer Neg Hx   . Rectal cancer Neg Hx   . Stomach cancer Neg Hx     ROS- All systems are reviewed and negative except as per the HPI above  Physical Exam: Vitals:   09/01/18 1409  BP: 126/72  Pulse: (!) 52  Weight: 96.6 kg  Height: 6\' 3"  (1.905 m)   Wt Readings from Last 3 Encounters:  09/01/18 96.6 kg  08/22/18 97.9 kg  04/06/17 100.2 kg    Labs: Lab Results  Component Value Date   NA 138 09/01/2018   K 4.6 09/01/2018   CL 106 09/01/2018   CO2 24 09/01/2018   GLUCOSE 93 09/01/2018   BUN 19 09/01/2018   CREATININE 1.35 (H) 09/01/2018   CALCIUM 9.2 09/01/2018   No results found for: INR No results found for: CHOL, HDL, LDLCALC, TRIG   GEN- The patient is well appearing, alert and oriented x 3 today.   Head- normocephalic, atraumatic Eyes-  Sclera clear, conjunctiva pink Ears- hearing intact Oropharynx- clear Neck- supple, no JVP Lymph- no cervical lymphadenopathy Lungs- Clear to  ausculation bilaterally, normal work of breathing Heart- Regular rate and rhythm, no murmurs, rubs or gallops, PMI not laterally displaced GI- soft, NT, ND, + BS Extremities- no clubbing, cyanosis, or edema MS- no significant deformity or atrophy Skin- no rash or lesion Psych-  euthymic mood, full affect Neuro- strength and sensation are intact  EKG-Sinus brady at 52 bpm, PR itn 200 ms, qrs int 100 ms, qtc 401 ms Echo-Study Conclusions  - Left ventricle: The cavity size was normal. Systolic function was   normal. The estimated ejection fraction was in the range of 60%   to 65%. Wall motion was normal; there were no regional wall   motion abnormalities. Left ventricular diastolic function   parameters were normal. - Aortic valve: Valve area (VTI): 2.72 cm^2. Valve area (Vmean):   2.72 cm^2. - Left atrium: The atrium was mildly dilated. - Atrial septum: No defect or patent foramen ovale was identified.   Assessment and Plan: 1. New onset afib in the setting of infection General discussion re afib/triggers Continue flecainide75 mg bid Continue metoprolol tartrate 50 mg bid   2.CHA2DS2VAScscore of 2 Continue xarelto 20 mg daily  Bmet today shows a crcl cal at 66.60, based on creatinine of 1.35 mg/dl, appropriately dosed Bleeding precautions discussed CBC drawn today wnl  F/u with Dr. Harrington Challenger in 4 weeks afib clinic as needed  Ricky Singh, [facility] [address] [place], [place] [address] [phone]

## 2018-09-06 ENCOUNTER — Ambulatory Visit: Payer: Medicare Other | Admitting: Physician Assistant

## 2018-10-13 ENCOUNTER — Ambulatory Visit (INDEPENDENT_AMBULATORY_CARE_PROVIDER_SITE_OTHER): Payer: Medicare Other | Admitting: Internal Medicine

## 2018-10-13 ENCOUNTER — Encounter: Payer: Self-pay | Admitting: Internal Medicine

## 2018-10-13 VITALS — BP 130/70 | HR 50 | Ht 74.5 in | Wt 216.0 lb

## 2018-10-13 DIAGNOSIS — I480 Paroxysmal atrial fibrillation: Secondary | ICD-10-CM

## 2018-10-13 NOTE — Patient Instructions (Signed)
Medication Instructions:  Your physician recommends that you continue on your current medications as directed. Please refer to the Current Medication list given to you today.  If you need a refill on your cardiac medications before your next appointment, please call your pharmacy.   Lab work: NONE  If you have labs (blood work) drawn today and your tests are completely normal, you will receive your results only by: Marland Kitchen MyChart Message (if you have MyChart) OR . A paper copy in the mail If you have any lab test that is abnormal or we need to change your treatment, we will call you to review the results.  Testing/Procedures: NONE   Follow-Up: At [facility], you and your health needs are our priority.  As part of our continuing mission to provide you with exceptional heart care, we have created designated Provider Care Teams.  These Care Teams include your primary Cardiologist (physician) and Advanced Practice Providers (APPs -  Physician Assistants and Nurse Practitioners) who all work together to provide you with the care you need, when you need it. You will need a follow up appointment in 6 months.  Please call our office 2 months in advance to schedule this appointment.  You may [place]  or one of the following Advanced Practice Providers on your designated Care Team:   Chanetta Marshall, NP . Tommye Standard, PA-C  Any Other Special Instructions Will Be Listed Below (If Applicable). Thank you for choosing [facility]!

## 2018-10-13 NOTE — Progress Notes (Signed)
HPI [name] returns today for followup of atrial fib. He is a pleasant 73 yo man who was admitted several weeks ago with atrial fib in the setting of scarlet fever. His atrial fib resolved on flecainide. He has tolerated his systemic anti-coagulation. He did not feel his atrial fib except for some increased dyspnea. He is active and has no limitation to exertion. No chest pain or palpitations.  No Known Allergies   Current Outpatient Medications  Medication Sig Dispense Refill  . acetaminophen (TYLENOL) 325 MG tablet Take 2 tablets (650 mg total) by mouth every 6 (six) hours as needed for mild pain (or Fever >/= 101).    . brimonidine (ALPHAGAN) 0.15 % ophthalmic solution Place 1 drop into the right eye 2 (two) times daily.  11  . flecainide (TAMBOCOR) 150 MG tablet Take 0.5 tablets (75 mg total) by mouth every 12 (twelve) hours. 90 tablet 2  . gabapentin (NEURONTIN) 300 MG capsule Take 300 mg by mouth 2 (two) times daily.     Marland Kitchen latanoprost (XALATAN) 0.005 % ophthalmic solution Place 1 drop into both eyes daily.    . metoprolol tartrate (LOPRESSOR) 50 MG tablet Take 1 tablet (50 mg total) by mouth 2 (two) times daily. 180 tablet 2  . Multiple Vitamins-Minerals (PRESERVISION AREDS 2) CAPS Take 1 capsule by mouth daily.    . Omega-3 Fatty Acids (FISH OIL) 1200 MG CAPS Take 1 capsule by mouth 2 (two) times daily.     . rivaroxaban (XARELTO) 20 MG TABS tablet Take 1 tablet (20 mg total) by mouth daily with supper. 90 tablet 2   No current facility-administered medications for this visit.      Past Medical History:  Diagnosis Date  . Allergy   . Ankle fracture, left   . Arthritis   . Cataract   . Chronic kidney disease (CKD), stage III (moderate) (HCC)   . Femur fracture, right ([place])   . Glaucoma   . Hypertension   . Macular degeneration   . Scoliosis     ROS:   All systems reviewed and negative except as noted in the HPI.   Past Surgical History:  Procedure Laterality  Date  . COLONOSCOPY    . DENTAL SURGERY    . [place]  . TONSILLECTOMY  1951   or 1952     Family History  Problem Relation Age of Onset  . Lymphoma Father 92       started in testicles  . Hypertension Mother   . Angina Maternal Grandfather   . Kidney cancer Cousin   . Colon cancer Neg Hx   . Esophageal cancer Neg Hx   . Rectal cancer Neg Hx   . Stomach cancer Neg Hx      Social History   Socioeconomic History  . Marital status: Married    Spouse name: Not on file  . Number of children: Not on file  . Years of education: Not on file  . Highest education level: Not on file  Occupational History  . Not on file  Social Needs  . Financial resource strain: Not on file  . Food insecurity:    Worry: Not on file    Inability: Not on file  . Transportation needs:    Medical: Not on file    Non-medical: Not on file  Tobacco Use  . Smoking status: Former Research scientist (life sciences)  . Smokeless tobacco: Never Used  . Tobacco comment: over  50 years ago   Substance and Sexual Activity  . Alcohol use: Not Currently    Alcohol/week: 0.0 standard drinks  . Drug use: No  . Sexual activity: Not on file  Lifestyle  . Physical activity:    Days per week: Not on file    Minutes per session: Not on file  . Stress: Not on file  Relationships  . Social connections:    Talks on phone: Not on file    Gets together: Not on file    Attends religious service: Not on file    Active member of club or organization: Not on file    Attends meetings of clubs or organizations: Not on file    Relationship status: Not on file  . Intimate partner violence:    Fear of current or ex partner: Not on file    Emotionally abused: Not on file    Physically abused: Not on file    Forced sexual activity: Not on file  Other Topics Concern  . Not on file  Social History Narrative  . Not on file     BP 130/70 (BP Location: Left Arm)   Pulse (!) 50   Ht 6' 2.5" (1.892 m)   Wt 216 lb (98 kg)    SpO2 98%   BMI 27.36 kg/m   Physical Exam:  Well appearing NAD HEENT: Unremarkable Neck:  No JVD, no thyromegally Lymphatics:  No adenopathy Back:  No CVA tenderness Lungs:  Clear with no wheezes HEART:  Regular rate rhythm, no murmurs, no rubs, no clicks Abd:  soft, positive bowel sounds, no organomegally, no rebound, no guarding Ext:  2 plus pulses, no edema, no cyanosis, no clubbing Skin:  No rashes no nodules Neuro:  CN II through XII intact, motor grossly intact   Assess/Plan: 1. PAF - he appears to be maintaining NSR. He will continue flecainide for now. We discussed stopping flecainide. 2. Sinus node dysfunction - he has sinus brady on beta blocker therapy. He does not appear to be symptomatic. We discussed the symptoms of sinus node dysfunction.  3. Coags - he has had no bleeding on Xarelto. We discussed the likely indefinite need for systemic anti-coagulation. 4. HTN - his  Blood pressure is well controlled. He will continue his beta blocker for now.  Mikle Bosworth.D.

## 2018-10-27 DIAGNOSIS — H5213 Myopia, bilateral: Secondary | ICD-10-CM | POA: Diagnosis not present

## 2018-10-27 DIAGNOSIS — H35453 Secondary pigmentary degeneration, bilateral: Secondary | ICD-10-CM | POA: Diagnosis not present

## 2018-10-27 DIAGNOSIS — H35723 Serous detachment of retinal pigment epithelium, bilateral: Secondary | ICD-10-CM | POA: Diagnosis not present

## 2018-10-27 DIAGNOSIS — H353122 Nonexudative age-related macular degeneration, left eye, intermediate dry stage: Secondary | ICD-10-CM | POA: Diagnosis not present

## 2018-10-27 DIAGNOSIS — H43391 Other vitreous opacities, right eye: Secondary | ICD-10-CM | POA: Diagnosis not present

## 2018-10-27 DIAGNOSIS — H401212 Low-tension glaucoma, right eye, moderate stage: Secondary | ICD-10-CM | POA: Diagnosis not present

## 2018-10-27 DIAGNOSIS — H43813 Vitreous degeneration, bilateral: Secondary | ICD-10-CM | POA: Diagnosis not present

## 2018-10-27 DIAGNOSIS — H2513 Age-related nuclear cataract, bilateral: Secondary | ICD-10-CM | POA: Diagnosis not present

## 2018-10-27 DIAGNOSIS — H35373 Puckering of macula, bilateral: Secondary | ICD-10-CM | POA: Diagnosis not present

## 2018-10-27 DIAGNOSIS — H25813 Combined forms of age-related cataract, bilateral: Secondary | ICD-10-CM | POA: Diagnosis not present

## 2018-10-27 DIAGNOSIS — H353211 Exudative age-related macular degeneration, right eye, with active choroidal neovascularization: Secondary | ICD-10-CM | POA: Diagnosis not present

## 2018-10-27 DIAGNOSIS — H35363 Drusen (degenerative) of macula, bilateral: Secondary | ICD-10-CM | POA: Diagnosis not present

## 2018-10-27 DIAGNOSIS — H401132 Primary open-angle glaucoma, bilateral, moderate stage: Secondary | ICD-10-CM | POA: Diagnosis not present

## 2018-11-10 DIAGNOSIS — M5416 Radiculopathy, lumbar region: Secondary | ICD-10-CM | POA: Diagnosis not present

## 2018-11-26 DIAGNOSIS — H353211 Exudative age-related macular degeneration, right eye, with active choroidal neovascularization: Secondary | ICD-10-CM | POA: Diagnosis not present

## 2018-12-23 DIAGNOSIS — R918 Other nonspecific abnormal finding of lung field: Secondary | ICD-10-CM | POA: Diagnosis not present

## 2018-12-23 DIAGNOSIS — M545 Low back pain: Secondary | ICD-10-CM | POA: Diagnosis not present

## 2018-12-23 DIAGNOSIS — I4891 Unspecified atrial fibrillation: Secondary | ICD-10-CM | POA: Diagnosis not present

## 2018-12-23 DIAGNOSIS — Z1331 Encounter for screening for depression: Secondary | ICD-10-CM | POA: Diagnosis not present

## 2018-12-23 DIAGNOSIS — R32 Unspecified urinary incontinence: Secondary | ICD-10-CM | POA: Insufficient documentation

## 2018-12-27 ENCOUNTER — Other Ambulatory Visit: Payer: Self-pay | Admitting: Internal Medicine

## 2018-12-27 DIAGNOSIS — R918 Other nonspecific abnormal finding of lung field: Secondary | ICD-10-CM

## 2019-01-05 DIAGNOSIS — H353211 Exudative age-related macular degeneration, right eye, with active choroidal neovascularization: Secondary | ICD-10-CM | POA: Diagnosis not present

## 2019-01-10 ENCOUNTER — Telehealth: Payer: Self-pay | Admitting: Internal Medicine

## 2019-01-10 NOTE — Telephone Encounter (Signed)
Mr. Gloeckner would like to speak about an appointment with Dr. Lovena Le .  [phone] (cell)

## 2019-02-01 DIAGNOSIS — I10 Essential (primary) hypertension: Secondary | ICD-10-CM | POA: Diagnosis not present

## 2019-02-01 DIAGNOSIS — R82998 Other abnormal findings in urine: Secondary | ICD-10-CM | POA: Diagnosis not present

## 2019-02-01 DIAGNOSIS — Z125 Encounter for screening for malignant neoplasm of prostate: Secondary | ICD-10-CM | POA: Diagnosis not present

## 2019-02-01 DIAGNOSIS — Z79899 Other long term (current) drug therapy: Secondary | ICD-10-CM | POA: Diagnosis not present

## 2019-02-07 DIAGNOSIS — I4891 Unspecified atrial fibrillation: Secondary | ICD-10-CM | POA: Diagnosis not present

## 2019-02-07 DIAGNOSIS — Z0000 Encounter for general adult medical examination without abnormal findings: Secondary | ICD-10-CM | POA: Diagnosis not present

## 2019-02-07 DIAGNOSIS — M545 Low back pain: Secondary | ICD-10-CM | POA: Diagnosis not present

## 2019-02-07 DIAGNOSIS — Z1331 Encounter for screening for depression: Secondary | ICD-10-CM | POA: Diagnosis not present

## 2019-02-07 DIAGNOSIS — H353211 Exudative age-related macular degeneration, right eye, with active choroidal neovascularization: Secondary | ICD-10-CM | POA: Diagnosis not present

## 2019-02-07 DIAGNOSIS — R918 Other nonspecific abnormal finding of lung field: Secondary | ICD-10-CM | POA: Diagnosis not present

## 2019-02-07 DIAGNOSIS — N183 Chronic kidney disease, stage 3 (moderate): Secondary | ICD-10-CM | POA: Diagnosis not present

## 2019-02-07 DIAGNOSIS — I129 Hypertensive chronic kidney disease with stage 1 through stage 4 chronic kidney disease, or unspecified chronic kidney disease: Secondary | ICD-10-CM | POA: Diagnosis not present

## 2019-02-07 DIAGNOSIS — R351 Nocturia: Secondary | ICD-10-CM | POA: Diagnosis not present

## 2019-02-14 ENCOUNTER — Other Ambulatory Visit: Payer: Medicare Other

## 2019-02-28 ENCOUNTER — Ambulatory Visit
Admission: RE | Admit: 2019-02-28 | Discharge: 2019-02-28 | Disposition: A | Discharge status: Home / Self Care | Payer: Medicare Other | Source: Ambulatory Visit | Attending: Internal Medicine | Admitting: Internal Medicine

## 2019-02-28 DIAGNOSIS — R918 Other nonspecific abnormal finding of lung field: Secondary | ICD-10-CM

## 2019-02-28 MED ORDER — IOPAMIDOL (ISOVUE-300) INJECTION 61%
75.0000 mL | Freq: Once | INTRAVENOUS | Status: AC | PRN
  Administered 2019-02-28: 75 mL via INTRAVENOUS

## 2019-03-06 ENCOUNTER — Encounter (HOSPITAL_COMMUNITY): Payer: Self-pay | Admitting: Emergency Medicine

## 2019-03-06 ENCOUNTER — Emergency Department (HOSPITAL_COMMUNITY): Payer: Medicare Other

## 2019-03-06 ENCOUNTER — Other Ambulatory Visit: Payer: Self-pay

## 2019-03-06 ENCOUNTER — Emergency Department (HOSPITAL_COMMUNITY)
Admission: EM | Admit: 2019-03-06 | Discharge: 2019-03-06 | Disposition: A | Discharge status: Home / Self Care | Payer: Medicare Other | Attending: Emergency Medicine | Admitting: Emergency Medicine

## 2019-03-06 DIAGNOSIS — Y92007 Garden or yard of unspecified non-institutional (private) residence as the place of occurrence of the external cause: Secondary | ICD-10-CM | POA: Insufficient documentation

## 2019-03-06 DIAGNOSIS — S6991XA Unspecified injury of right wrist, hand and finger(s), initial encounter: Secondary | ICD-10-CM | POA: Diagnosis not present

## 2019-03-06 DIAGNOSIS — Z23 Encounter for immunization: Secondary | ICD-10-CM | POA: Insufficient documentation

## 2019-03-06 DIAGNOSIS — S0081XA Abrasion of other part of head, initial encounter: Secondary | ICD-10-CM | POA: Insufficient documentation

## 2019-03-06 DIAGNOSIS — S0083XA Contusion of other part of head, initial encounter: Secondary | ICD-10-CM | POA: Diagnosis not present

## 2019-03-06 DIAGNOSIS — Z87891 Personal history of nicotine dependence: Secondary | ICD-10-CM | POA: Insufficient documentation

## 2019-03-06 DIAGNOSIS — S0003XA Contusion of scalp, initial encounter: Secondary | ICD-10-CM | POA: Diagnosis not present

## 2019-03-06 DIAGNOSIS — R0789 Other chest pain: Secondary | ICD-10-CM | POA: Diagnosis not present

## 2019-03-06 DIAGNOSIS — N183 Chronic kidney disease, stage 3 (moderate): Secondary | ICD-10-CM | POA: Diagnosis not present

## 2019-03-06 DIAGNOSIS — W010XXA Fall on same level from slipping, tripping and stumbling without subsequent striking against object, initial encounter: Secondary | ICD-10-CM | POA: Insufficient documentation

## 2019-03-06 DIAGNOSIS — Z7901 Long term (current) use of anticoagulants: Secondary | ICD-10-CM | POA: Diagnosis not present

## 2019-03-06 DIAGNOSIS — I129 Hypertensive chronic kidney disease with stage 1 through stage 4 chronic kidney disease, or unspecified chronic kidney disease: Secondary | ICD-10-CM | POA: Insufficient documentation

## 2019-03-06 DIAGNOSIS — M25531 Pain in right wrist: Secondary | ICD-10-CM | POA: Diagnosis not present

## 2019-03-06 DIAGNOSIS — S0990XA Unspecified injury of head, initial encounter: Secondary | ICD-10-CM

## 2019-03-06 DIAGNOSIS — Y999 Unspecified external cause status: Secondary | ICD-10-CM | POA: Diagnosis not present

## 2019-03-06 DIAGNOSIS — Y93H2 Activity, gardening and landscaping: Secondary | ICD-10-CM | POA: Insufficient documentation

## 2019-03-06 DIAGNOSIS — W19XXXA Unspecified fall, initial encounter: Secondary | ICD-10-CM

## 2019-03-06 DIAGNOSIS — Z79899 Other long term (current) drug therapy: Secondary | ICD-10-CM | POA: Insufficient documentation

## 2019-03-06 DIAGNOSIS — R0781 Pleurodynia: Secondary | ICD-10-CM | POA: Diagnosis not present

## 2019-03-06 DIAGNOSIS — S299XXA Unspecified injury of thorax, initial encounter: Secondary | ICD-10-CM | POA: Diagnosis not present

## 2019-03-06 MED ORDER — TETANUS-DIPHTH-ACELL PERTUSSIS 5-2.5-18.5 LF-MCG/0.5 IM SUSP
0.5000 mL | Freq: Once | INTRAMUSCULAR | Status: AC
  Administered 2019-03-06: 0.5 mL via INTRAMUSCULAR
  Filled 2019-03-06: qty 0.5

## 2019-03-06 NOTE — Discharge Instructions (Addendum)
Return for any problem.  Follow-up with your regular care provider as instructed.  Apply ice as instructed.

## 2019-03-06 NOTE — ED Provider Notes (Signed)
[facility] EMERGENCY DEPARTMENT Provider Note   CSN: [id number] Arrival date & time: 03/06/19  1244    History   Chief Complaint Chief Complaint  Patient presents with  . Fall    HPI Child Ricky Singh is a 73 y.o. male.     73 year old male with prior medical history as detailed below presents for evaluation following reported fall.  Patient reports that he was outside in his yard doing yard work this morning.  He had a fall.  He struck his head.  He is sure that he lost his balance.  He cannot recall the other exact events of the fall.  Apparently after the fall he was able to walk into his house.  He was able to put his tremor up prior to trying to take off his boots when he realized that he had an version to the right temple.  He complains of pain to the right temple.  He also is complaining of mild tenderness to the right ribs.  He denies associated shortness of breath.  He denies lower extremity pain or injury.  He is able to ambulate without difficulty.  Of note, he is on Xarelto.  The history is provided by the patient and medical records.  Fall  This is a new problem. The current episode started 3 to 5 hours ago. The problem occurs rarely. The problem has been gradually improving. Pertinent negatives include no chest pain, no abdominal pain, no headaches and no shortness of breath. Nothing aggravates the symptoms. Nothing relieves the symptoms.    Past Medical History:  Diagnosis Date  . Allergy   . Ankle fracture, left   . Arthritis   . Cataract   . Chronic kidney disease (CKD), stage III (moderate) (HCC)   . Femur fracture, right ([place])   . Glaucoma   . Hypertension   . Macular degeneration   . Scoliosis     Patient Active Problem List   Diagnosis Date Noted  . Fever 08/18/2018  . Sepsis ([place]) 08/17/2018  . Strep pharyngitis with scarlet fever 08/17/2018  . Atrial fibrillation with RVR ([place]) 08/17/2018  . CKD (chronic kidney disease), stage III  ([place]) 08/17/2018    Past Surgical History:  Procedure Laterality Date  . COLONOSCOPY    . DENTAL SURGERY    . [place]  . TONSILLECTOMY  1951   or 1952        Home Medications    Prior to Admission medications   Medication Sig Start Date End Date Taking? Authorizing Provider  acetaminophen (TYLENOL) 325 MG tablet Take 2 tablets (650 mg total) by mouth every 6 (six) hours as needed for mild pain (or Fever >/= 101). 08/22/18   Elgergawy, Silver Huguenin, MD  brimonidine (ALPHAGAN) 0.15 % ophthalmic solution Place 1 drop into the right eye 2 (two) times daily. 06/07/18   [provider]  flecainide (TAMBOCOR) 150 MG tablet Take 0.5 tablets (75 mg total) by mouth every 12 (twelve) hours. 09/01/18   Sherran Needs, NP  gabapentin (NEURONTIN) 300 MG capsule Take 300 mg by mouth 2 (two) times daily.     [provider]  latanoprost (XALATAN) 0.005 % ophthalmic solution Place 1 drop into both eyes daily. 06/07/18   [provider]  metoprolol tartrate (LOPRESSOR) 50 MG tablet Take 1 tablet (50 mg total) by mouth 2 (two) times daily. 09/01/18   Sherran Needs, NP  Multiple Vitamins-Minerals (PRESERVISION AREDS 2) CAPS Take  1 capsule by mouth daily.    [provider]  Omega-3 Fatty Acids (FISH OIL) 1200 MG CAPS Take 1 capsule by mouth 2 (two) times daily.     [provider]  rivaroxaban (XARELTO) 20 MG TABS tablet Take 1 tablet (20 mg total) by mouth daily with supper. 09/01/18   Sherran Needs, NP    Family History Family History  Problem Relation Age of Onset  . Lymphoma Father 70       started in testicles  . Hypertension Mother   . Angina Maternal Grandfather   . Kidney cancer Cousin   . Colon cancer Neg Hx   . Esophageal cancer Neg Hx   . Rectal cancer Neg Hx   . Stomach cancer Neg Hx     Social History Social History   Tobacco Use  . Smoking status: Former Research scientist (life sciences)  . Smokeless tobacco: Never Used  . Tobacco  comment: over 50 years ago   Substance Use Topics  . Alcohol use: Not Currently    Alcohol/week: 0.0 standard drinks  . Drug use: No     Allergies   Patient has no known allergies.   Review of Systems Review of Systems  Respiratory: Negative for shortness of breath.   Cardiovascular: Negative for chest pain.  Gastrointestinal: Negative for abdominal pain.  Neurological: Negative for headaches.  All other systems reviewed and are negative.    Physical Exam Updated Vital Signs BP (!) 193/109 (BP Location: Right Arm)   Pulse 65   Temp 99.1 F (37.3 C) (Oral)   Resp 14   Ht 5\' 10"  (1.778 m)   Wt 97.5 kg   SpO2 100%   BMI 30.85 kg/m   Physical Exam Vitals signs and nursing note reviewed.  Constitutional:      General: He is not in acute distress.    Appearance: He is well-developed.  HENT:     Head: Normocephalic.     Comments: Abrasion and contusion to the right temple noted.  No active bleeding.  EOMI.  Mild ecchymosis around the right orbit noted. Eyes:     Conjunctiva/sclera: Conjunctivae normal.     Pupils: Pupils are equal, round, and reactive to light.  Neck:     Musculoskeletal: Normal range of motion and neck supple.  Cardiovascular:     Rate and Rhythm: Normal rate and regular rhythm.     Heart sounds: Normal heart sounds.  Pulmonary:     Effort: Pulmonary effort is normal. No respiratory distress.     Breath sounds: Normal breath sounds.  Abdominal:     General: There is no distension.     Palpations: Abdomen is soft.     Tenderness: There is no abdominal tenderness.  Musculoskeletal: Normal range of motion.        General: No deformity.  Skin:    General: Skin is warm and dry.     Comments: Abrasions noted to the right temple, right lateral shoulder, and right ribs.  Neurological:     Mental Status: He is alert and oriented to person, place, and time.      ED Treatments / Results  Labs (all labs ordered are listed, but only abnormal  results are displayed) Labs Reviewed - No data to display  EKG EKG Interpretation  Date/Time:  Sunday March 06 2019 12:57:05 EDT Ventricular Rate:  68 PR Interval:    QRS Duration: 109 QT Interval:  422 QTC Calculation: 449 R Axis:   60 Text Interpretation:  Sinus rhythm Short PR interval Probable left atrial enlargement Abnormal R-wave progression, early transition Nonspecific T abnrm, anterolateral leads Confirmed by Dene Gentry [phone]) on 03/06/2019 1:04:22 PM   Radiology Dg Ribs Unilateral W/chest Right  Result Date: 03/06/2019 CLINICAL DATA:  Fall this morning with lateral upper rib pain. History of lung nodules and coughing up blood yesterday. EXAM: RIGHT RIBS AND CHEST - 3+ VIEW COMPARISON:  08/18/2018 FINDINGS: Lungs are adequately inflated without focal airspace consolidation or effusion. No pneumothorax. Mild vascular crowding over the right infrahilar region unchanged. Cardiomediastinal silhouette is within normal. There are degenerative changes of the spine. No acute rib fracture. IMPRESSION: No acute findings. Electronically Signed   By: Marin Olp M.D.   On: 03/06/2019 14:07   Dg Wrist Complete Right  Result Date: 03/06/2019 CLINICAL DATA:  Fall this morning with right lateral wrist pain. EXAM: RIGHT WRIST - COMPLETE 3+ VIEW COMPARISON:  None. FINDINGS: Minimal degenerate change over the radiocarpal joint and first carpometacarpal joint. No evidence of acute fracture or dislocation. IMPRESSION: No acute findings. Electronically Signed   By: Marin Olp M.D.   On: 03/06/2019 14:08   Ct Head Wo Contrast  Result Date: 03/06/2019 CLINICAL DATA:  Fall.  Right base swelling and abrasions. EXAM: CT HEAD WITHOUT CONTRAST CT MAXILLOFACIAL WITHOUT CONTRAST TECHNIQUE: Multidetector CT imaging of the head and maxillofacial structures were performed using the standard protocol without intravenous contrast. Multiplanar CT image reconstructions of the maxillofacial structures were also  generated. COMPARISON:  None. FINDINGS: CT HEAD FINDINGS Brain: No evidence of acute infarction, hemorrhage, hydrocephalus, extra-axial collection or mass lesion/mass effect. Mild to moderate age related atrophy. Vascular: No hyperdense vessel or unexpected calcification. Skull: Normal. Negative for fracture or focal lesion. Other: Right frontal scalp hematoma. CT MAXILLOFACIAL FINDINGS Osseous: No fracture or mandibular dislocation. No destructive process. Orbits: Negative. No traumatic or inflammatory finding. Sinuses: Minimal mucosal thickening in the right maxillary sinus and right ethmoid air cells. Remaining sinuses and mastoid air cells are clear. Soft tissues: Right facial hematoma overlying the right frontal process. IMPRESSION: 1.  No acute intracranial abnormality. 2.  No acute maxillofacial fracture. 3. Right frontal scalp and facial hematoma. Electronically Signed   By: Titus Dubin M.D.   On: 03/06/2019 14:34   Ct Maxillofacial Wo Cm  Result Date: 03/06/2019 CLINICAL DATA:  Fall.  Right base swelling and abrasions. EXAM: CT HEAD WITHOUT CONTRAST CT MAXILLOFACIAL WITHOUT CONTRAST TECHNIQUE: Multidetector CT imaging of the head and maxillofacial structures were performed using the standard protocol without intravenous contrast. Multiplanar CT image reconstructions of the maxillofacial structures were also generated. COMPARISON:  None. FINDINGS: CT HEAD FINDINGS Brain: No evidence of acute infarction, hemorrhage, hydrocephalus, extra-axial collection or mass lesion/mass effect. Mild to moderate age related atrophy. Vascular: No hyperdense vessel or unexpected calcification. Skull: Normal. Negative for fracture or focal lesion. Other: Right frontal scalp hematoma. CT MAXILLOFACIAL FINDINGS Osseous: No fracture or mandibular dislocation. No destructive process. Orbits: Negative. No traumatic or inflammatory finding. Sinuses: Minimal mucosal thickening in the right maxillary sinus and right ethmoid air  cells. Remaining sinuses and mastoid air cells are clear. Soft tissues: Right facial hematoma overlying the right frontal process. IMPRESSION: 1.  No acute intracranial abnormality. 2.  No acute maxillofacial fracture. 3. Right frontal scalp and facial hematoma. Electronically Signed   By: Titus Dubin M.D.   On: 03/06/2019 14:34    Procedures Procedures (including critical care time)  Medications Ordered in ED Medications  Tdap (BOOSTRIX) injection 0.5 mL (  0.5 mLs Intramuscular Given 03/06/19 1312)     Initial Impression / Assessment and Plan / ED Course  I have reviewed the triage vital signs and the nursing notes.  Pertinent labs & imaging results that were available during my care of the patient were reviewed by me and considered in my medical decision making ([facility] chart for details).        MDM  Screen complete  Lomax Poehler Petrides was evaluated in Emergency Department on 03/06/2019 for the symptoms described in the history of present illness. He was evaluated in the context of the global COVID-19 pandemic, which necessitated consideration that the patient might be at risk for infection with the SARS-CoV-2 virus that causes COVID-19. Institutional protocols and algorithms that pertain to the evaluation of patients at risk for COVID-19 are in a state of rapid change based on information released by regulatory bodies including the CDC and federal and state organizations. These policies and algorithms were followed during the patient's care in the ED.   Patient is presenting for evaluation following reported fall.  He is on Xarelto.  His injuries are not suggestive of significant acute pathology.   CT imaging and plain films are without evidence of significant acute traumatic injury.  Patient is remained comfortable during his evaluation.  He now desires discharge home.  He does understand the need for close follow-up.  Strict return precautions given and understood.   Final Clinical  Impressions(s) / ED Diagnoses   Final diagnoses:  Fall, initial encounter  Closed head injury, initial encounter    ED Discharge Orders    None       Valarie Merino, MD 03/06/19 1453

## 2019-03-06 NOTE — ED Notes (Signed)
Patient verbalizes understanding of discharge instructions. Opportunity for questioning and answers were provided. Armband removed by staff, pt discharged from ED.  

## 2019-03-06 NOTE — ED Notes (Signed)
ED Provider at bedside. 

## 2019-03-06 NOTE — ED Triage Notes (Signed)
Reports fall this morning while trimming his bushes at 1100 today.  Currently on xarelto.  Reports head injury with LOC, right shoulder, and right rib pain.  Alert and oriented at present-to room via wheelchair.

## 2019-03-14 DIAGNOSIS — H353211 Exudative age-related macular degeneration, right eye, with active choroidal neovascularization: Secondary | ICD-10-CM | POA: Diagnosis not present

## 2019-04-14 DIAGNOSIS — H353211 Exudative age-related macular degeneration, right eye, with active choroidal neovascularization: Secondary | ICD-10-CM | POA: Diagnosis not present

## 2019-04-25 ENCOUNTER — Ambulatory Visit: Payer: Medicare Other | Admitting: Internal Medicine

## 2019-04-28 ENCOUNTER — Telehealth: Payer: Self-pay

## 2019-04-28 NOTE — Telephone Encounter (Signed)

## 2019-04-29 ENCOUNTER — Ambulatory Visit (INDEPENDENT_AMBULATORY_CARE_PROVIDER_SITE_OTHER): Payer: Medicare Other | Admitting: Internal Medicine

## 2019-04-29 ENCOUNTER — Other Ambulatory Visit: Payer: Self-pay

## 2019-04-29 ENCOUNTER — Encounter: Payer: Self-pay | Admitting: Internal Medicine

## 2019-04-29 DIAGNOSIS — I480 Paroxysmal atrial fibrillation: Secondary | ICD-10-CM | POA: Insufficient documentation

## 2019-04-29 DIAGNOSIS — I10 Essential (primary) hypertension: Secondary | ICD-10-CM | POA: Insufficient documentation

## 2019-04-29 DIAGNOSIS — I495 Sick sinus syndrome: Secondary | ICD-10-CM | POA: Diagnosis not present

## 2019-04-29 NOTE — Progress Notes (Signed)
HPI [name] returns today for followup of his atrial fib. He is a pleasant 73 yo man with PAF who had this in the setting of acute Scarlet fever, HTN, and sinus node dysfunction. He feels well. He notes a fall a month ago with no serious injury. He had a single episode where he thought he might have been out of rhythm a few days ago. Otherwise he has had no symptomatic PAF.  No Known Allergies   Current Outpatient Medications  Medication Sig Dispense Refill  . acetaminophen (TYLENOL) 325 MG tablet Take 2 tablets (650 mg total) by mouth every 6 (six) hours as needed for mild pain (or Fever >/= 101).    . brimonidine (ALPHAGAN) 0.15 % ophthalmic solution Place 1 drop into the right eye 2 (two) times daily.  11  . flecainide (TAMBOCOR) 150 MG tablet Take 0.5 tablets (75 mg total) by mouth every 12 (twelve) hours. 90 tablet 2  . gabapentin (NEURONTIN) 300 MG capsule Take 300 mg by mouth 3 (three) times daily.     Marland Kitchen latanoprost (XALATAN) 0.005 % ophthalmic solution Place 1 drop into both eyes daily.    . metoprolol tartrate (LOPRESSOR) 50 MG tablet Take 1 tablet (50 mg total) by mouth 2 (two) times daily. 180 tablet 2  . Multiple Vitamins-Minerals (PRESERVISION AREDS 2) CAPS Take 1 capsule by mouth 2 (two) times daily.     . Omega-3 Fatty Acids (FISH OIL) 1200 MG CAPS Take 1 capsule by mouth 2 (two) times daily.     Marland Kitchen oxybutynin (DITROPAN) 5 MG tablet Take 5 mg by mouth daily.    . rivaroxaban (XARELTO) 20 MG TABS tablet Take 1 tablet (20 mg total) by mouth daily with supper. 90 tablet 2   No current facility-administered medications for this visit.      Past Medical History:  Diagnosis Date  . Allergy   . Ankle fracture, left   . Arthritis   . Cataract   . Chronic kidney disease (CKD), stage III (moderate) (HCC)   . Femur fracture, right ([place])   . Glaucoma   . Hypertension   . Macular degeneration   . Scoliosis     ROS:   All systems reviewed and negative except as noted  in the HPI.   Past Surgical History:  Procedure Laterality Date  . COLONOSCOPY    . DENTAL SURGERY    . [place]  . TONSILLECTOMY  1951   or 1952     Family History  Problem Relation Age of Onset  . Lymphoma Father 86       started in testicles  . Hypertension Mother   . Angina Maternal Grandfather   . Kidney cancer Cousin   . Colon cancer Neg Hx   . Esophageal cancer Neg Hx   . Rectal cancer Neg Hx   . Stomach cancer Neg Hx      Social History   Socioeconomic History  . Marital status: Married    Spouse name: Not on file  . Number of children: Not on file  . Years of education: Not on file  . Highest education level: Not on file  Occupational History  . Not on file  Social Needs  . Financial resource strain: Not on file  . Food insecurity    Worry: Not on file    Inability: Not on file  . Transportation needs    Medical: Not on file  Non-medical: Not on file  Tobacco Use  . Smoking status: Former Research scientist (life sciences)  . Smokeless tobacco: Never Used  . Tobacco comment: over 50 years ago   Substance and Sexual Activity  . Alcohol use: Not Currently    Alcohol/week: 0.0 standard drinks  . Drug use: No  . Sexual activity: Not on file  Lifestyle  . Physical activity    Days per week: Not on file    Minutes per session: Not on file  . Stress: Not on file  Relationships  . Social Herbalist on phone: Not on file    Gets together: Not on file    Attends religious service: Not on file    Active member of club or organization: Not on file    Attends meetings of clubs or organizations: Not on file    Relationship status: Not on file  . Intimate partner violence    Fear of current or ex partner: Not on file    Emotionally abused: Not on file    Physically abused: Not on file    Forced sexual activity: Not on file  Other Topics Concern  . Not on file  Social History Narrative  . Not on file     BP 124/76   Pulse (!) 49   Ht 5'  10" (1.778 m)   Wt 215 lb (97.5 kg)   SpO2 97%   BMI 30.85 kg/m   Physical Exam:  Well appearing NAD HEENT: Unremarkable Neck:  No JVD, no thyromegally Lymphatics:  No adenopathy Back:  No CVA tenderness Lungs:  Clear with no wheezes HEART:  Regular rate rhythm, no murmurs, no rubs, no clicks Abd:  soft, positive bowel sounds, no organomegally, no rebound, no guarding Ext:  2 plus pulses, no edema, no cyanosis, no clubbing Skin:  No rashes no nodules Neuro:  CN II through XII intact, motor grossly intact  EKG - NSR  Assess/Plan: 1. PAF - unclear if this remains a problem. I have reviewed the options and he will stop flecainide and undergo watchful waiting. If his symptoms return, he will restart the flecainide and notify us.  2. HTN - his symptoms are class 2. He will continue his current meds.  3. Sinus node dysfunction - he checks his HR regularly and is in the high 40's but appears to be asymptomatic.  4. Scarlet fever - this has resolved and he is asymptomatic.  [name], M.D.

## 2019-04-29 NOTE — Patient Instructions (Addendum)
Medication Instructions:  Your physician recommends that you continue on your current medications as directed. Please refer to the Current Medication list given to you today.  1.  STOP your flecainide  Labwork: None ordered.  Testing/Procedures: None ordered.  Follow-Up: Your physician wants you to follow-up in: 6 months with Dr. Lovena Le in  [place].   You will receive a reminder letter in the mail two months in advance. If you don't receive a letter, please call our office to schedule the follow-up appointment.   Any Other Special Instructions Will Be Listed Below (If Applicable).  If you need a refill on your cardiac medications before your next appointment, please call your pharmacy.

## 2019-05-04 DIAGNOSIS — H2513 Age-related nuclear cataract, bilateral: Secondary | ICD-10-CM | POA: Diagnosis not present

## 2019-05-04 DIAGNOSIS — H5213 Myopia, bilateral: Secondary | ICD-10-CM | POA: Diagnosis not present

## 2019-05-04 DIAGNOSIS — H401212 Low-tension glaucoma, right eye, moderate stage: Secondary | ICD-10-CM | POA: Diagnosis not present

## 2019-05-04 DIAGNOSIS — H25013 Cortical age-related cataract, bilateral: Secondary | ICD-10-CM | POA: Diagnosis not present

## 2019-05-10 DIAGNOSIS — N183 Chronic kidney disease, stage 3 (moderate): Secondary | ICD-10-CM | POA: Diagnosis not present

## 2019-05-10 DIAGNOSIS — J189 Pneumonia, unspecified organism: Secondary | ICD-10-CM | POA: Insufficient documentation

## 2019-05-10 DIAGNOSIS — Z79899 Other long term (current) drug therapy: Secondary | ICD-10-CM | POA: Diagnosis not present

## 2019-05-10 DIAGNOSIS — R918 Other nonspecific abnormal finding of lung field: Secondary | ICD-10-CM | POA: Diagnosis not present

## 2019-05-10 DIAGNOSIS — I4891 Unspecified atrial fibrillation: Secondary | ICD-10-CM | POA: Diagnosis not present

## 2019-05-10 DIAGNOSIS — R509 Fever, unspecified: Secondary | ICD-10-CM | POA: Diagnosis not present

## 2019-05-10 DIAGNOSIS — I129 Hypertensive chronic kidney disease with stage 1 through stage 4 chronic kidney disease, or unspecified chronic kidney disease: Secondary | ICD-10-CM | POA: Diagnosis not present

## 2019-05-10 DIAGNOSIS — Z20818 Contact with and (suspected) exposure to other bacterial communicable diseases: Secondary | ICD-10-CM | POA: Diagnosis not present

## 2019-05-30 ENCOUNTER — Other Ambulatory Visit (HOSPITAL_COMMUNITY): Payer: Self-pay | Admitting: Nurse Practitioner

## 2019-05-30 DIAGNOSIS — J189 Pneumonia, unspecified organism: Secondary | ICD-10-CM | POA: Diagnosis not present

## 2019-06-02 DIAGNOSIS — H353211 Exudative age-related macular degeneration, right eye, with active choroidal neovascularization: Secondary | ICD-10-CM | POA: Diagnosis not present

## 2019-06-11 DIAGNOSIS — Z23 Encounter for immunization: Secondary | ICD-10-CM | POA: Diagnosis not present

## 2019-06-23 DIAGNOSIS — H2512 Age-related nuclear cataract, left eye: Secondary | ICD-10-CM | POA: Diagnosis not present

## 2019-06-23 DIAGNOSIS — H25812 Combined forms of age-related cataract, left eye: Secondary | ICD-10-CM | POA: Diagnosis not present

## 2019-06-23 DIAGNOSIS — H25012 Cortical age-related cataract, left eye: Secondary | ICD-10-CM | POA: Diagnosis not present

## 2019-07-06 DIAGNOSIS — H353211 Exudative age-related macular degeneration, right eye, with active choroidal neovascularization: Secondary | ICD-10-CM | POA: Diagnosis not present

## 2019-07-22 DIAGNOSIS — H353132 Nonexudative age-related macular degeneration, bilateral, intermediate dry stage: Secondary | ICD-10-CM | POA: Diagnosis not present

## 2019-07-28 DIAGNOSIS — H25011 Cortical age-related cataract, right eye: Secondary | ICD-10-CM | POA: Diagnosis not present

## 2019-07-28 DIAGNOSIS — H2511 Age-related nuclear cataract, right eye: Secondary | ICD-10-CM | POA: Diagnosis not present

## 2019-07-28 DIAGNOSIS — H25811 Combined forms of age-related cataract, right eye: Secondary | ICD-10-CM | POA: Diagnosis not present

## 2019-08-08 DIAGNOSIS — H353211 Exudative age-related macular degeneration, right eye, with active choroidal neovascularization: Secondary | ICD-10-CM | POA: Diagnosis not present

## 2019-10-12 ENCOUNTER — Ambulatory Visit (INDEPENDENT_AMBULATORY_CARE_PROVIDER_SITE_OTHER): Payer: Medicare Other | Admitting: Internal Medicine

## 2019-10-12 ENCOUNTER — Encounter: Payer: Self-pay | Admitting: Internal Medicine

## 2019-10-12 VITALS — BP 128/74 | HR 74 | Temp 97.8°F | Ht 74.0 in | Wt 216.0 lb

## 2019-10-12 DIAGNOSIS — I480 Paroxysmal atrial fibrillation: Secondary | ICD-10-CM

## 2019-10-12 DIAGNOSIS — I10 Essential (primary) hypertension: Secondary | ICD-10-CM | POA: Diagnosis not present

## 2019-10-12 NOTE — Progress Notes (Signed)
HPI Ricky Singh returns today for followup of his atrial fib. He is a pleasant 74 yo man with a h/o PAF. He stopped his flecainide over a year ago. He has had very rare palpitations. He denies chest pain or sob. No syncope. He is exercising regularly. He has stopped drinking ETOH. 1 caffeine drink a day. No Known Allergies   Current Outpatient Medications  Medication Sig Dispense Refill  . acetaminophen (TYLENOL) 325 MG tablet Take 2 tablets (650 mg total) by mouth every 6 (six) hours as needed for mild pain (or Fever >/= 101).    . brimonidine (ALPHAGAN) 0.15 % ophthalmic solution Place 1 drop into the right eye 2 (two) times daily.  11  . gabapentin (NEURONTIN) 300 MG capsule Take 300 mg by mouth 3 (three) times daily.     Marland Kitchen latanoprost (XALATAN) 0.005 % ophthalmic solution Place 1 drop into both eyes daily.    . metoprolol tartrate (LOPRESSOR) 50 MG tablet TAKE 1 TABLET TWICE A DAY 180 tablet 2  . Multiple Vitamins-Minerals (PRESERVISION AREDS 2) CAPS Take 1 capsule by mouth 2 (two) times daily.     . Omega-3 Fatty Acids (FISH OIL) 1200 MG CAPS Take 1 capsule by mouth 2 (two) times daily.     Marland Kitchen oxybutynin (DITROPAN) 5 MG tablet Take 5 mg by mouth daily.    Ricky Singh 20 MG TABS tablet TAKE 1 TABLET DAILY WITH   SUPPER 90 tablet 2   No current facility-administered medications for this visit.     Past Medical History:  Diagnosis Date  . Allergy   . Ankle fracture, left   . Arthritis   . Cataract   . Chronic kidney disease (CKD), stage III (moderate)   . Femur fracture, right ([place])   . Glaucoma   . Hypertension   . Macular degeneration   . Scoliosis     ROS:   All systems reviewed and negative except as noted in the HPI.   Past Surgical History:  Procedure Laterality Date  . COLONOSCOPY    . DENTAL SURGERY    . Ricky Singh  . TONSILLECTOMY  1951   or 1952     Family History  Problem Relation Age of Onset  . Lymphoma Father 39       started in  testicles  . Hypertension Mother   . Angina Maternal Grandfather   . Kidney cancer Cousin   . Colon cancer Neg Hx   . Esophageal cancer Neg Hx   . Rectal cancer Neg Hx   . Stomach cancer Neg Hx      Social History   Socioeconomic History  . Marital status: Married    Spouse name: Not on file  . Number of children: Not on file  . Years of education: Not on file  . Highest education level: Not on file  Occupational History  . Not on file  Tobacco Use  . Smoking status: Former Research scientist (life sciences)  . Smokeless tobacco: Never Used  . Tobacco comment: over 50 years ago   Substance and Sexual Activity  . Alcohol use: Not Currently    Alcohol/week: 0.0 standard drinks  . Drug use: No  . Sexual activity: Not on file  Other Topics Concern  . Not on file  Social History Narrative  . Not on file   Social Determinants of Health   Financial Resource Strain:   . Difficulty of Paying Living Expenses: Not on file  Food Insecurity:   . Worried About Charity fundraiser in the Last Year: Not on file  . Ran Out of Food in the Last Year: Not on file  Transportation Needs:   . Lack of Transportation (Medical): Not on file  . Lack of Transportation (Non-Medical): Not on file  Physical Activity:   . Days of Exercise per Week: Not on file  . Minutes of Exercise per Session: Not on file  Stress:   . Feeling of Stress : Not on file  Social Connections:   . Frequency of Communication with Friends and Family: Not on file  . Frequency of Social Gatherings with Friends and Family: Not on file  . Attends Religious Services: Not on file  . Active Member of Clubs or Organizations: Not on file  . Attends Archivist Meetings: Not on file  . Marital Status: Not on file  Intimate Partner Violence:   . Fear of Current or Ex-Partner: Not on file  . Emotionally Abused: Not on file  . Physically Abused: Not on file  . Sexually Abused: Not on file     BP 128/74   Pulse 74   Temp 97.8 F (36.6  C)   Ht 6\' 2"  (1.88 m)   Wt 216 lb (98 kg)   SpO2 97%   BMI 27.73 kg/m   Physical Exam:  Well appearing NAD HEENT: Unremarkable Neck:  No JVD, no thyromegally Lymphatics:  No adenopathy Back:  No CVA tenderness Lungs:  Clear HEART:  Regular rate rhythm, no murmurs, no rubs, no clicks Abd:  soft, positive bowel sounds, no organomegally, no rebound, no guarding Ext:  2 plus pulses, no edema, no cyanosis, no clubbing Skin:  No rashes no nodules Neuro:  CN II through XII intact, motor grossly intact   Assess/Plan: 1. PAf - he appears to be maintaining NSR. He will continue his beta blocker and systemic anti-coagulation.  2. HTN - his bp is fairly well controlled. He is encouraged to continue exercising.   Mikle Bosworth.D.

## 2019-10-12 NOTE — Patient Instructions (Signed)
Medication Instructions:  Your physician recommends that you continue on your current medications as directed. Please refer to the Current Medication list given to you today.  *If you need a refill on your cardiac medications before your next appointment, please call your pharmacy*  Lab Work: NONE  If you have labs (blood work) drawn today and your tests are completely normal, you will receive your results only by: . MyChart Message (if you have MyChart) OR . A paper copy in the mail If you have any lab test that is abnormal or we need to change your treatment, we will call you to review the results.  Testing/Procedures: NONE   Follow-Up: At [facility], you and your health needs are our priority.  As part of our continuing mission to provide you with exceptional heart care, we have created designated Provider Care Teams.  These Care Teams include your primary Cardiologist (physician) and Advanced Practice Providers (APPs -  Physician Assistants and Nurse Practitioners) who all work together to provide you with the care you need, when you need it.  Your next appointment:   1 year(s)  The format for your next appointment:   In Person  Provider:   Gregg Taylor, MD  Other Instructions Thank you for choosing [facility]!    

## 2019-10-17 DIAGNOSIS — H353211 Exudative age-related macular degeneration, right eye, with active choroidal neovascularization: Secondary | ICD-10-CM | POA: Diagnosis not present

## 2019-11-13 DIAGNOSIS — Z23 Encounter for immunization: Secondary | ICD-10-CM | POA: Diagnosis not present

## 2019-11-30 DIAGNOSIS — H353211 Exudative age-related macular degeneration, right eye, with active choroidal neovascularization: Secondary | ICD-10-CM | POA: Diagnosis not present

## 2019-12-05 DIAGNOSIS — H401234 Low-tension glaucoma, bilateral, indeterminate stage: Secondary | ICD-10-CM | POA: Diagnosis not present

## 2019-12-11 DIAGNOSIS — Z23 Encounter for immunization: Secondary | ICD-10-CM | POA: Diagnosis not present

## 2020-01-02 DIAGNOSIS — H353211 Exudative age-related macular degeneration, right eye, with active choroidal neovascularization: Secondary | ICD-10-CM | POA: Diagnosis not present

## 2020-02-06 DIAGNOSIS — H353211 Exudative age-related macular degeneration, right eye, with active choroidal neovascularization: Secondary | ICD-10-CM | POA: Diagnosis not present

## 2020-02-10 ENCOUNTER — Other Ambulatory Visit (HOSPITAL_COMMUNITY): Payer: Self-pay | Admitting: Nurse Practitioner

## 2020-03-05 DIAGNOSIS — I129 Hypertensive chronic kidney disease with stage 1 through stage 4 chronic kidney disease, or unspecified chronic kidney disease: Secondary | ICD-10-CM | POA: Diagnosis not present

## 2020-03-05 DIAGNOSIS — Z125 Encounter for screening for malignant neoplasm of prostate: Secondary | ICD-10-CM | POA: Diagnosis not present

## 2020-03-12 DIAGNOSIS — R82998 Other abnormal findings in urine: Secondary | ICD-10-CM | POA: Diagnosis not present

## 2020-03-17 DIAGNOSIS — W57XXXA Bitten or stung by nonvenomous insect and other nonvenomous arthropods, initial encounter: Secondary | ICD-10-CM | POA: Diagnosis not present

## 2020-03-17 DIAGNOSIS — L0390 Cellulitis, unspecified: Secondary | ICD-10-CM | POA: Diagnosis not present

## 2020-04-18 DIAGNOSIS — H353211 Exudative age-related macular degeneration, right eye, with active choroidal neovascularization: Secondary | ICD-10-CM | POA: Diagnosis not present

## 2020-05-23 DIAGNOSIS — H353211 Exudative age-related macular degeneration, right eye, with active choroidal neovascularization: Secondary | ICD-10-CM | POA: Diagnosis not present

## 2020-06-06 DIAGNOSIS — H04123 Dry eye syndrome of bilateral lacrimal glands: Secondary | ICD-10-CM | POA: Diagnosis not present

## 2020-06-06 DIAGNOSIS — H353211 Exudative age-related macular degeneration, right eye, with active choroidal neovascularization: Secondary | ICD-10-CM | POA: Diagnosis not present

## 2020-06-06 DIAGNOSIS — H401212 Low-tension glaucoma, right eye, moderate stage: Secondary | ICD-10-CM | POA: Diagnosis not present

## 2020-06-06 DIAGNOSIS — H524 Presbyopia: Secondary | ICD-10-CM | POA: Diagnosis not present

## 2020-06-30 DIAGNOSIS — Z23 Encounter for immunization: Secondary | ICD-10-CM | POA: Diagnosis not present

## 2020-07-03 IMAGING — CT CT HEAD WITHOUT CONTRAST
4 of 12 series · 17 of 47 positions shown, 19 images · non-contrast
Comparison: None.

CLINICAL DATA: Fall.  Right base swelling and abrasions.

EXAM:
CT HEAD WITHOUT CONTRAST
CT MAXILLOFACIAL WITHOUT CONTRAST
TECHNIQUE: Multidetector CT imaging of the head and maxillofacial structures
were performed using the standard protocol without intravenous
contrast. Multiplanar CT image reconstructions of the maxillofacial
structures were also generated.

[Series 6: cor soft · coronal · 0.36mm/px · 2 of 77 slices shown]
[im 26/77  brain]
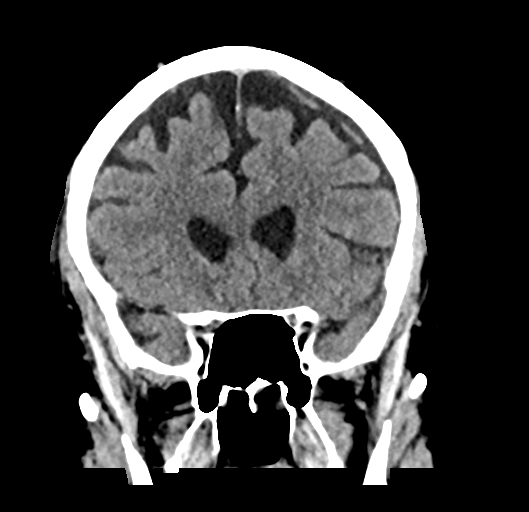
[im 51/77  brain]
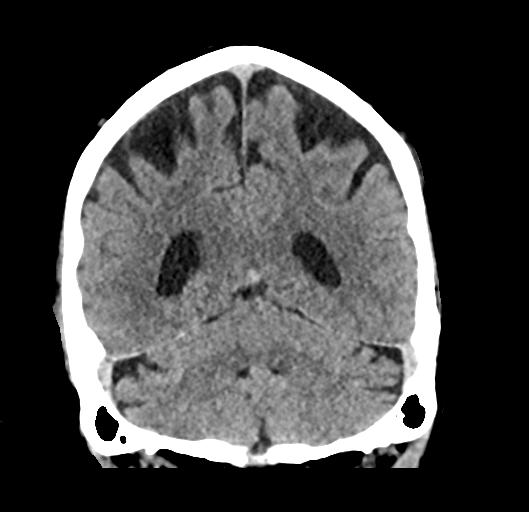

[Series 9: st thins · axial · 0.40mm/px · z∈[-173,-23]mm · 8 of 276 slices shown, 10 images]
[im 31/276  brain]
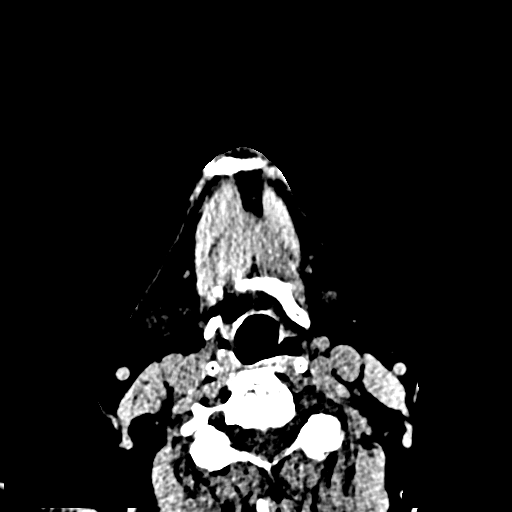
[im 31/276  bone]
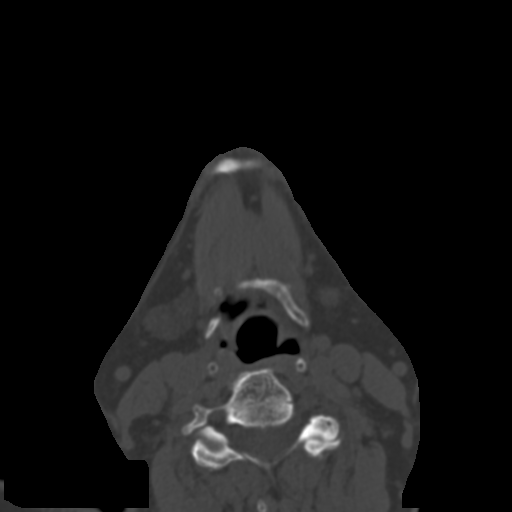
[im 62/276  brain]
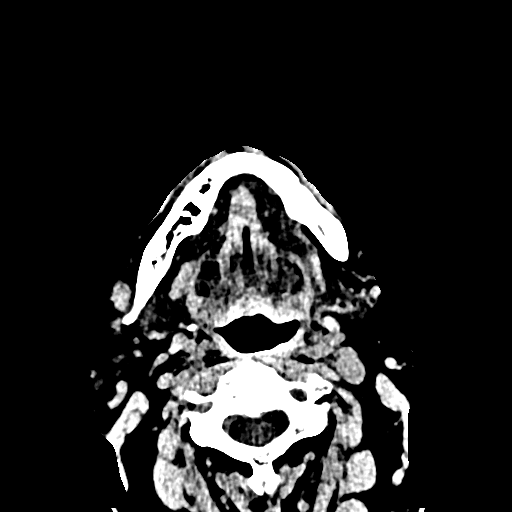
[im 92/276  brain]
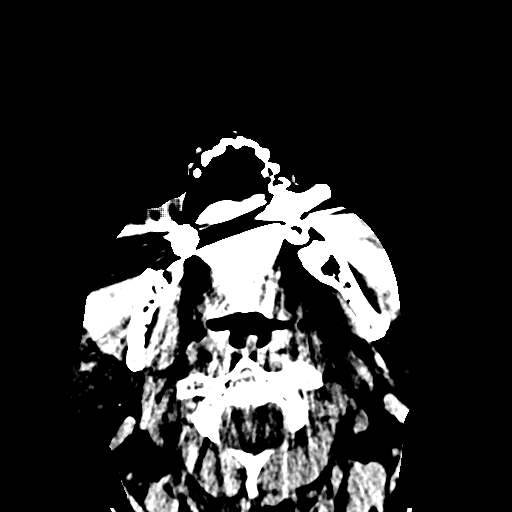
[im 123/276  brain]
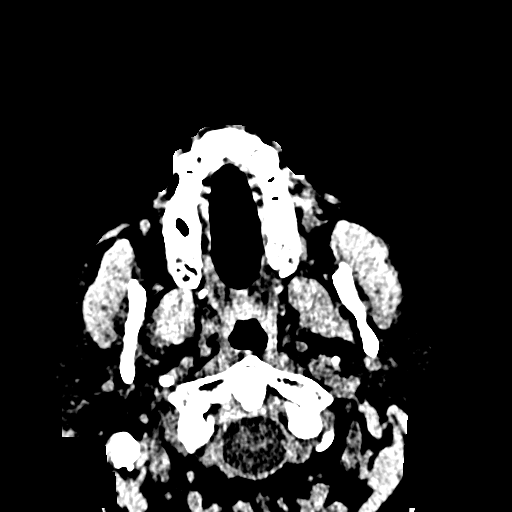
[im 153/276  brain]
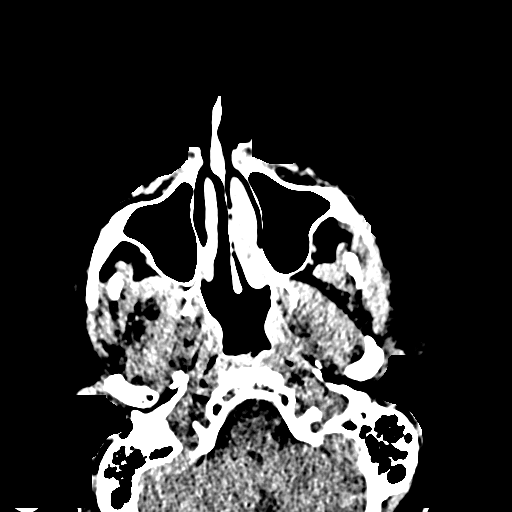
[im 153/276  bone]
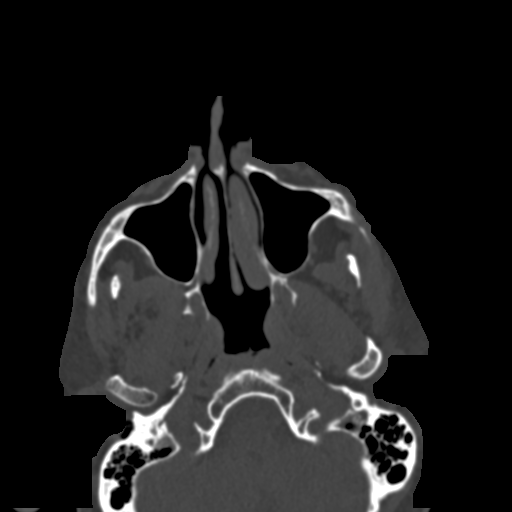
[im 184/276  brain]
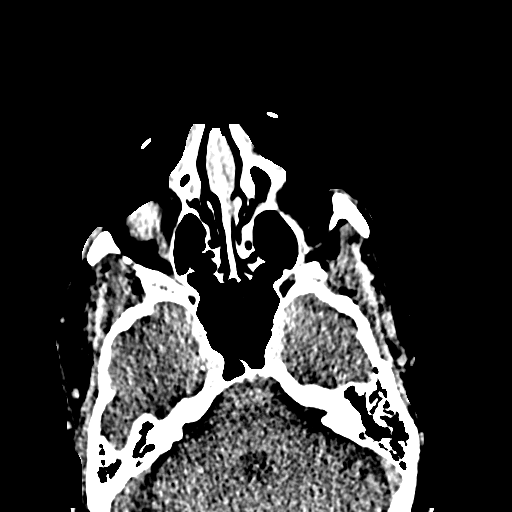
[im 214/276  brain]
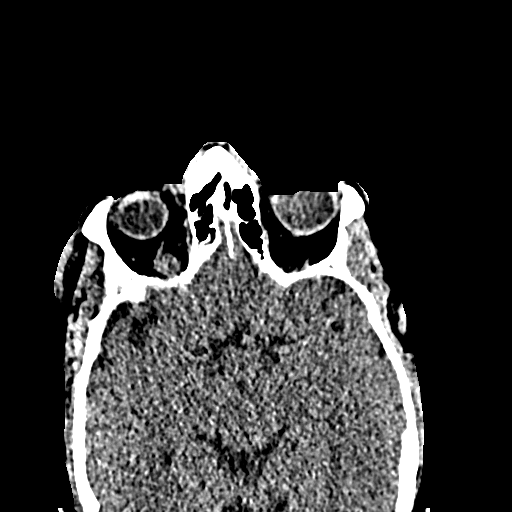
[im 245/276  brain]
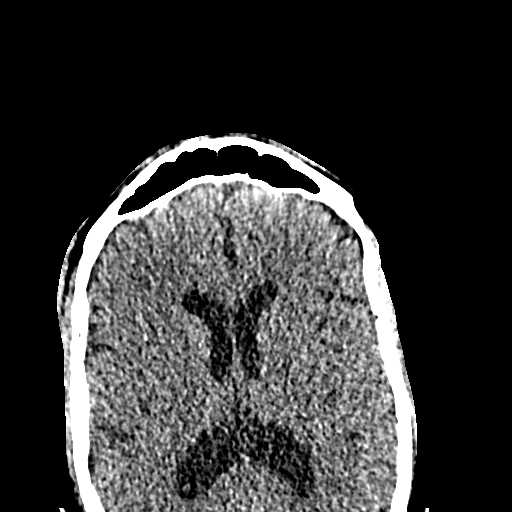

[Series 10: bone thins · axial · 0.40mm/px · z∈[-173,-66]mm · 6 of 276 slices shown]
[im 31/276  bone]
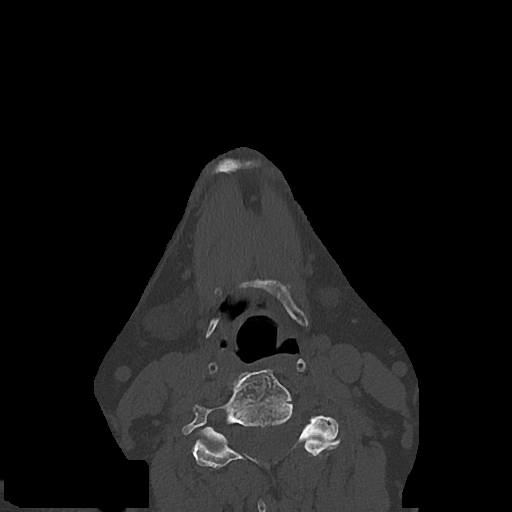
[im 62/276  bone]
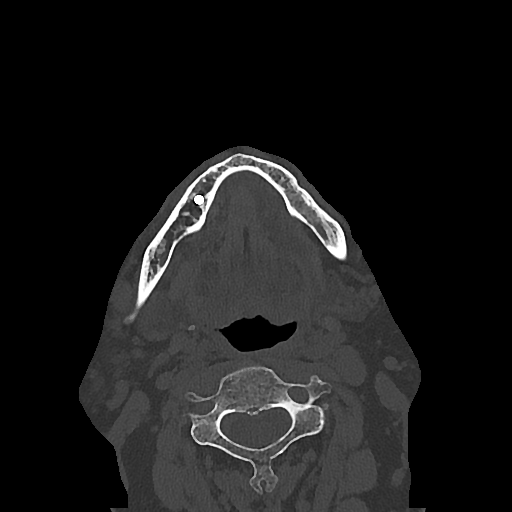
[im 92/276  bone]
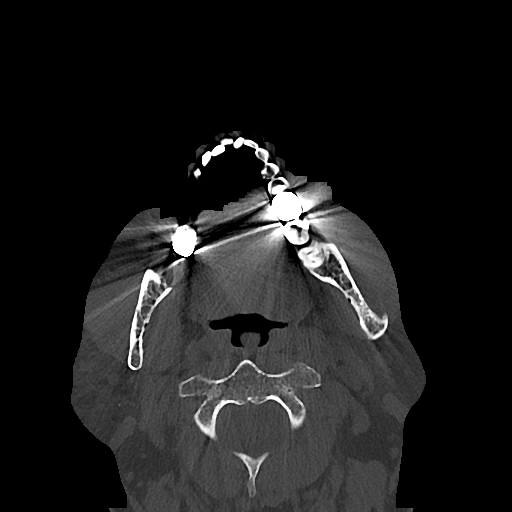
[im 123/276  bone]
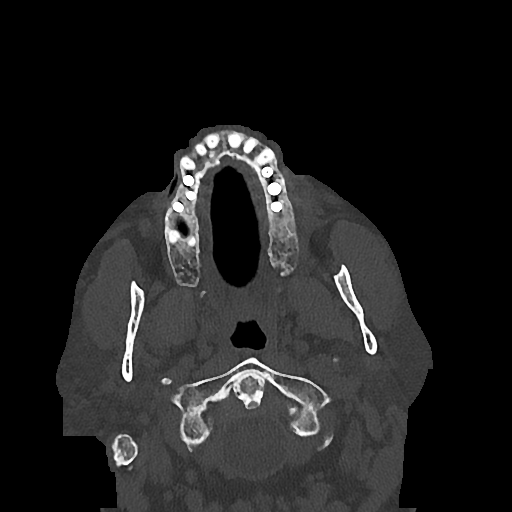
[im 153/276  bone]
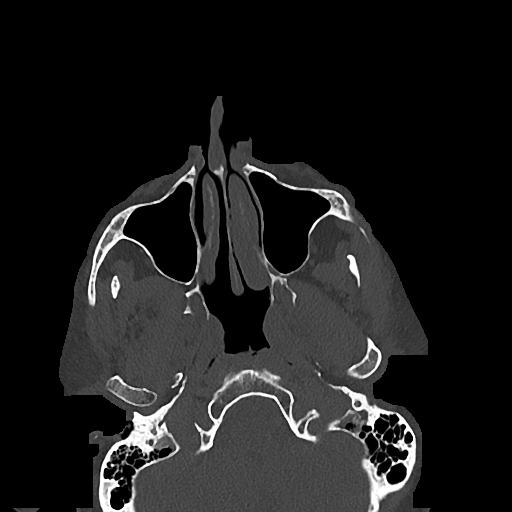
[im 184/276  bone]
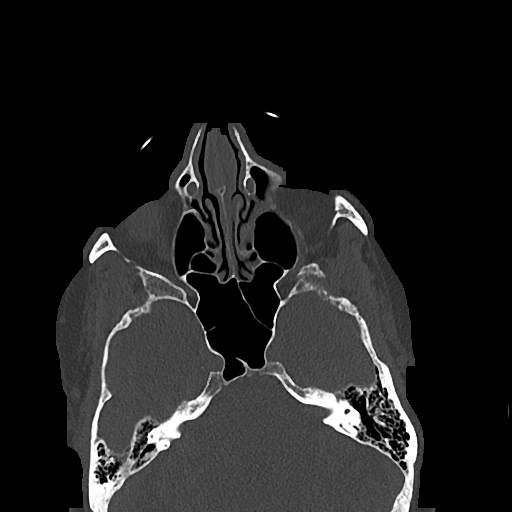

[Series 12: st sag · sagittal · 0.42mm/px · 1 of 79 slices shown]
[im 40/79  brain]
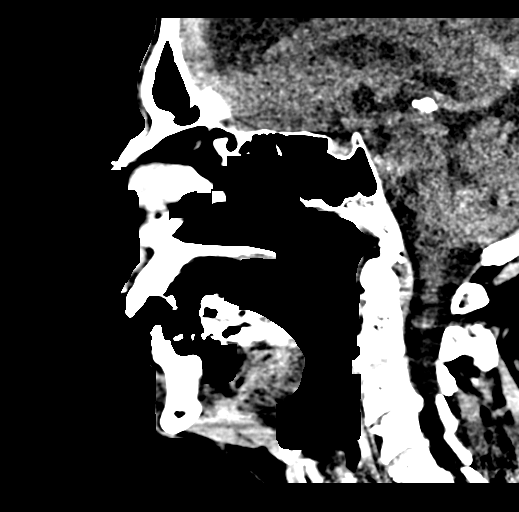

[17 of 47 positions shown; findings below may reference images not displayed]

FINDINGS: CT HEAD FINDINGS

Brain: No evidence of acute infarction, hemorrhage, hydrocephalus,
extra-axial collection or mass lesion/mass effect. Mild to moderate
age related atrophy.

Vascular: No hyperdense vessel or unexpected calcification.

Skull: Normal. Negative for fracture or focal lesion.

Other: Right frontal scalp hematoma.

CT MAXILLOFACIAL FINDINGS

Osseous: No fracture or mandibular dislocation. No destructive
process.

Orbits: Negative. No traumatic or inflammatory finding.

Sinuses: Minimal mucosal thickening in the right maxillary sinus and
right ethmoid air cells. Remaining sinuses and mastoid air cells are
clear.

Soft tissues: Right facial hematoma overlying the right frontal
process.
IMPRESSION: 1.  No acute intracranial abnormality.
2.  No acute maxillofacial fracture.
3. Right frontal scalp and facial hematoma.

## 2020-07-04 DIAGNOSIS — H353211 Exudative age-related macular degeneration, right eye, with active choroidal neovascularization: Secondary | ICD-10-CM | POA: Diagnosis not present

## 2020-08-27 DIAGNOSIS — H353211 Exudative age-related macular degeneration, right eye, with active choroidal neovascularization: Secondary | ICD-10-CM | POA: Diagnosis not present

## 2020-10-15 ENCOUNTER — Other Ambulatory Visit (HOSPITAL_COMMUNITY): Payer: Self-pay | Admitting: Internal Medicine

## 2020-10-17 ENCOUNTER — Encounter: Payer: Self-pay | Admitting: Internal Medicine

## 2020-10-17 ENCOUNTER — Other Ambulatory Visit: Payer: Self-pay

## 2020-10-17 ENCOUNTER — Ambulatory Visit (INDEPENDENT_AMBULATORY_CARE_PROVIDER_SITE_OTHER): Payer: Medicare Other | Admitting: Internal Medicine

## 2020-10-17 VITALS — BP 140/82 | HR 48 | Ht 75.0 in | Wt 213.8 lb

## 2020-10-17 DIAGNOSIS — I480 Paroxysmal atrial fibrillation: Secondary | ICD-10-CM | POA: Diagnosis not present

## 2020-10-17 NOTE — Progress Notes (Signed)
HPI Mr Crisp returns today for followup of his atrial fib. He is a pleasant 75 yo man with a h/o PAF. He stopped his flecainide over a year ago. He has had very rare palpitations. He denies chest pain or sob. No syncope. He is exercising regularly. He has stopped drinking ETOH. 1 caffeine drink a day. No Known Allergies   Current Outpatient Medications  Medication Sig Dispense Refill  . acetaminophen (TYLENOL) 325 MG tablet Take 2 tablets (650 mg total) by mouth every 6 (six) hours as needed for mild pain (or Fever >/= 101).    . brimonidine (ALPHAGAN) 0.15 % ophthalmic solution Place 1 drop into the right eye 2 (two) times daily.  11  . chlorhexidine (PERIDEX) 0.12 % solution SMARTSIG:By Mouth    . gabapentin (NEURONTIN) 300 MG capsule Take 300 mg by mouth 3 (three) times daily.     Marland Kitchen latanoprost (XALATAN) 0.005 % ophthalmic solution Place 1 drop into both eyes daily.    . metoprolol tartrate (LOPRESSOR) 50 MG tablet TAKE 1 TABLET TWICE A DAY 180 tablet 2  . Multiple Vitamins-Minerals (PRESERVISION AREDS 2) CAPS Take 1 capsule by mouth 2 (two) times daily.     . Omega-3 Fatty Acids (FISH OIL) 1200 MG CAPS Take 1 capsule by mouth 2 (two) times daily.     Marland Kitchen oxybutynin (DITROPAN) 5 MG tablet Take 5 mg by mouth daily.    Alveda Reasons 20 MG TABS tablet TAKE 1 TABLET DAILY WITH   SUPPER 90 tablet 2   No current facility-administered medications for this visit.     Past Medical History:  Diagnosis Date  . Allergy   . Ankle fracture, left   . Arthritis   . Cataract   . Chronic kidney disease (CKD), stage III (moderate) (HCC)   . Femur fracture, right ([place])   . Glaucoma   . Hypertension   . Macular degeneration   . Scoliosis     ROS:   All systems reviewed and negative except as noted in the HPI.   Past Surgical History:  Procedure Laterality Date  . COLONOSCOPY    . DENTAL SURGERY    . [place]  . TONSILLECTOMY  1951   or 1952     Family History   Problem Relation Age of Onset  . Lymphoma Father 81       started in testicles  . Hypertension Mother   . Angina Maternal Grandfather   . Kidney cancer Cousin   . Colon cancer Neg Hx   . Esophageal cancer Neg Hx   . Rectal cancer Neg Hx   . Stomach cancer Neg Hx      Social History   Socioeconomic History  . Marital status: Married    Spouse name: Not on file  . Number of children: Not on file  . Years of education: Not on file  . Highest education level: Not on file  Occupational History  . Not on file  Tobacco Use  . Smoking status: Former Research scientist (life sciences)  . Smokeless tobacco: Never Used  . Tobacco comment: over 50 years ago   Vaping Use  . Vaping Use: Never used  Substance and Sexual Activity  . Alcohol use: Yes    Alcohol/week: 0.0 standard drinks    Comment: rare   . Drug use: No  . Sexual activity: Not on file  Other Topics Concern  . Not on file  Social History Narrative  .  Not on file   Social Determinants of Health   Financial Resource Strain: Not on file  Food Insecurity: Not on file  Transportation Needs: Not on file  Physical Activity: Not on file  Stress: Not on file  Social Connections: Not on file  Intimate Partner Violence: Not on file     BP 140/82   Pulse (!) 48   Ht 6\' 3"  (1.905 m)   Wt 213 lb 12.8 oz (97 kg)   SpO2 96%   BMI 26.72 kg/m   Physical Exam:  Well appearing NAD HEENT: Unremarkable Neck:  No JVD, no thyromegally Lymphatics:  No adenopathy Back:  No CVA tenderness Lungs:  Clear with no wheezes HEART:  Regular brady rhythm, no murmurs, no rubs, no clicks Abd:  soft, positive bowel sounds, no organomegally, no rebound, no guarding Ext:  2 plus pulses, no edema, no cyanosis, no clubbing Skin:  No rashes no nodules Neuro:  CN II through XII intact, motor grossly intact  EKG - sinus bradycardia   Assess/Plan: 1. PAF- he appears to be maintaining NSR. He will continue his beta blocker and systemic anti-coagulation. He has  not required any flecainide.  2. HTN - his bp is fairly well controlled. He is encouraged to continue exercising 3. Sinus node dysfunction - he is asymptomatic. We discussed the symptoms he might experience if his sinus node dysfunction were to worsen.  Carleene Overlie Charis Juliana,MD

## 2020-10-17 NOTE — Patient Instructions (Signed)
Medication Instructions:  Your physician recommends that you continue on your current medications as directed. Please refer to the Current Medication list given to you today.  *If you need a refill on your cardiac medications before your next appointment, please call your pharmacy*   Lab Work: NONE   If you have labs (blood work) drawn today and your tests are completely normal, you will receive your results only by: . MyChart Message (if you have MyChart) OR . A paper copy in the mail If you have any lab test that is abnormal or we need to change your treatment, we will call you to review the results.   Testing/Procedures: NONE    Follow-Up: At [facility], you and your health needs are our priority.  As part of our continuing mission to provide you with exceptional heart care, we have created designated Provider Care Teams.  These Care Teams include your primary Cardiologist (physician) and Advanced Practice Providers (APPs -  Physician Assistants and Nurse Practitioners) who all work together to provide you with the care you need, when you need it.  We recommend signing up for the patient portal called "MyChart".  Sign up information is provided on this After Visit Summary.  MyChart is used to connect with patients for Virtual Visits (Telemedicine).  Patients are able to view lab/test results, encounter notes, upcoming appointments, etc.  Non-urgent messages can be sent to your provider as well.   To learn more about what you can do with MyChart, go to [email or web address].    Your next appointment:   1 year(s)  The format for your next appointment:   In Person  Provider:   Gregg Taylor, MD   Other Instructions Thank you for choosing [facility]!    

## 2021-08-05 ENCOUNTER — Telehealth: Payer: Self-pay | Admitting: Internal Medicine

## 2021-08-05 DIAGNOSIS — I480 Paroxysmal atrial fibrillation: Secondary | ICD-10-CM

## 2021-08-05 MED ORDER — RIVAROXABAN 20 MG PO TABS: ORAL_TABLET | ORAL | 0 refills | Status: DC

## 2021-08-05 NOTE — Telephone Encounter (Signed)
*  STAT* If patient is at the pharmacy, call can be transferred to refill team.   1. Which medications need to be refilled? (please list name of each medication and dose if known)  XARELTO 20 MG TABS tablet metoprolol tartrate (LOPRESSOR) 50 MG tablet  2. Which pharmacy/location (including street and city if local pharmacy) is medication to be sent to? [place], [place]  3. Do they need a 30 day or 90 day supply? 90 day

## 2021-08-05 NOTE — Telephone Encounter (Signed)
Prescription refill request for Xarelto received.  Indication: PAF Last office visit: 10/17/20 Weight: 97kg Age: 75 Scr: 1.2 on 03/05/20 CrCl: 71.85  Based on above findings Xarelto 20ng daily is the appropriate dose.  Needs updated CBC/BMP.  Refilled x 1.

## 2021-08-06 ENCOUNTER — Other Ambulatory Visit: Payer: Self-pay

## 2021-08-06 MED ORDER — METOPROLOL TARTRATE 50 MG PO TABS: 50.0000 mg | ORAL_TABLET | Freq: Two times a day (BID) | ORAL | 2 refills | Status: DC

## 2021-08-06 NOTE — Telephone Encounter (Signed)
Lab work ordered for CBC/BMP

## 2021-08-06 NOTE — Progress Notes (Signed)
Pt requested refill on Metoprolol Tartrate 50 mg tablets

## 2021-10-22 ENCOUNTER — Other Ambulatory Visit: Payer: Self-pay

## 2021-10-22 ENCOUNTER — Other Ambulatory Visit (HOSPITAL_COMMUNITY)
Admission: RE | Admit: 2021-10-22 | Discharge: 2021-10-22 | Disposition: A | Discharge status: Home / Self Care | Payer: Medicare Other | Source: Ambulatory Visit | Attending: Internal Medicine | Admitting: Internal Medicine

## 2021-10-22 DIAGNOSIS — I480 Paroxysmal atrial fibrillation: Secondary | ICD-10-CM | POA: Insufficient documentation

## 2021-10-22 LAB — BASIC METABOLIC PANEL
Anion gap: 7 (ref 5–15)
BUN: 24 mg/dL — ABNORMAL HIGH (ref 8–23)
CO2: 25 mmol/L (ref 22–32)
Calcium: 8.8 mg/dL — ABNORMAL LOW (ref 8.9–10.3)
Chloride: 104 mmol/L (ref 98–111)
Creatinine, Ser: 1.21 mg/dL (ref 0.61–1.24)
GFR, Estimated: >60 mL/min (ref >60)
Glucose, Bld: 105 mg/dL — ABNORMAL HIGH (ref 70–99)
Potassium: 4.4 mmol/L (ref 3.5–5.1)
Sodium: 136 mmol/L (ref 135–145)

## 2021-10-22 LAB — CBC
HCT: 43 % (ref 39.0–52.0)
Hemoglobin: 14.5 g/dL (ref 13.0–17.0)
MCH: 32.1 pg (ref 26.0–34.0)
MCHC: 33.7 g/dL (ref 30.0–36.0)
MCV: 95.1 fL (ref 80.0–100.0)
Platelets: 217 10*3/uL (ref 150–400)
RBC: 4.52 MIL/uL (ref 4.22–5.81)
RDW: 12.5 % (ref 11.5–15.5)
WBC: 6.7 10*3/uL (ref 4.0–10.5)
nRBC: 0 % (ref 0.0–0.2)

## 2021-10-29 ENCOUNTER — Ambulatory Visit (INDEPENDENT_AMBULATORY_CARE_PROVIDER_SITE_OTHER): Payer: Medicare Other | Admitting: Internal Medicine

## 2021-10-29 ENCOUNTER — Other Ambulatory Visit: Payer: Self-pay

## 2021-10-29 ENCOUNTER — Encounter: Payer: Self-pay | Admitting: Internal Medicine

## 2021-10-29 VITALS — BP 138/78 | HR 56 | Ht 75.0 in | Wt 218.0 lb

## 2021-10-29 DIAGNOSIS — I480 Paroxysmal atrial fibrillation: Secondary | ICD-10-CM

## 2021-10-29 NOTE — Patient Instructions (Signed)
Medication Instructions:  Your physician recommends that you continue on your current medications as directed. Please refer to the Current Medication list given to you today.  *If you need a refill on your cardiac medications before your next appointment, please call your pharmacy*   If you have labs (blood work) drawn today and your tests are completely normal, you will receive your results only by: [place] (if you have MyChart) OR A paper copy in the mail If you have any lab test that is abnormal or we need to change your treatment, we will call you to review the results.  Follow-Up: At [facility], you and your health needs are our priority.  As part of our continuing mission to provide you with exceptional heart care, we have created designated Provider Care Teams.  These Care Teams include your primary Cardiologist (physician) and Advanced Practice Providers (APPs -  Physician Assistants and Nurse Practitioners) who all work together to provide you with the care you need, when you need it.  We recommend signing up for the patient portal called "MyChart".  Sign up information is provided on this After Visit Summary.  MyChart is used to connect with patients for Virtual Visits (Telemedicine).  Patients are able to view lab/test results, encounter notes, upcoming appointments, etc.  Non-urgent messages can be sent to your provider as well.   To learn more about what you can do with MyChart, go to [email or web address].    Your next appointment:   1 year(s)  The format for your next appointment:   In Person  Provider:   [name], MD    Other Instructions Thank you for choosing [facility]!

## 2021-10-29 NOTE — Progress Notes (Signed)
HPI Ricky Singh returns today for followup of his atrial fib. He is a pleasant 76 yo man with a h/o PAF. He stopped his flecainide over 2 years ago. He has had very rare palpitations. He denies chest pain or sob. No syncope. He is exercising regularly. He has stopped drinking ETOH. 1 caffeine drink a day. He lost his wife of almost 16 years a few weeks ago.  No Known Allergies   Current Outpatient Medications  Medication Sig Dispense Refill   acetaminophen (TYLENOL) 325 MG tablet Take 2 tablets (650 mg total) by mouth every 6 (six) hours as needed for mild pain (or Fever >/= 101).     brimonidine (ALPHAGAN) 0.15 % ophthalmic solution Place 1 drop into the right eye 2 (two) times daily.  11   chlorhexidine (PERIDEX) 0.12 % solution SMARTSIG:By Mouth     gabapentin (NEURONTIN) 300 MG capsule Take 300 mg by mouth 3 (three) times daily.      latanoprost (XALATAN) 0.005 % ophthalmic solution Place 1 drop into both eyes daily.     metoprolol tartrate (LOPRESSOR) 50 MG tablet Take 1 tablet (50 mg total) by mouth 2 (two) times daily. 180 tablet 2   Multiple Vitamins-Minerals (PRESERVISION AREDS 2) CAPS Take 1 capsule by mouth 2 (two) times daily.      Omega-3 Fatty Acids (FISH OIL) 1200 MG CAPS Take 1 capsule by mouth 2 (two) times daily.      oxybutynin (DITROPAN) 5 MG tablet Take 5 mg by mouth daily.     rivaroxaban (XARELTO) 20 MG TABS tablet TAKE 1 TABLET DAILY WITH  SUPPER 90 tablet 0   No current facility-administered medications for this visit.     Past Medical History:  Diagnosis Date   Allergy    Ankle fracture, left    Arthritis    Cataract    Chronic kidney disease (CKD), stage III (moderate) (HCC)    Femur fracture, right (HCC)    Glaucoma    Hypertension    Macular degeneration    Scoliosis     ROS:   All systems reviewed and negative except as noted in the HPI.   Past Surgical History:  Procedure Laterality Date   COLONOSCOPY     DENTAL SURGERY     [place]   or 60     Family History  Problem Relation Age of Onset   Lymphoma Father 60       started in testicles   Hypertension Mother    Angina Maternal Grandfather    Kidney cancer Cousin    Colon cancer Neg Hx    Esophageal cancer Neg Hx    Rectal cancer Neg Hx    Stomach cancer Neg Hx      Social History   Socioeconomic History   Marital status: Married    Spouse name: Not on file   Number of children: Not on file   Years of education: Not on file   Highest education level: Not on file  Occupational History   Not on file  Tobacco Use   Smoking status: Former   Smokeless tobacco: Never   Tobacco comments:    over 50 years ago   Vaping Use   Vaping Use: Not on file  Substance and Sexual Activity   Alcohol use: Yes    Alcohol/week: 0.0 standard drinks    Comment: rare    Drug use: No  Sexual activity: Not on file  Other Topics Concern   Not on file  Social History Narrative   Not on file   Social Determinants of Health   Financial Resource Strain: Not on file  Food Insecurity: Not on file  Transportation Needs: Not on file  Physical Activity: Not on file  Stress: Not on file  Social Connections: Not on file  Intimate Partner Violence: Not on file     BP 138/78    Pulse (!) 56    Ht 6\' 3"  (1.905 m)    Wt 218 lb (98.9 kg)    SpO2 97%    BMI 27.25 kg/m   Physical Exam:  Well appearing NAD HEENT: Unremarkable Neck:  No JVD, no thyromegally Lymphatics:  No adenopathy Back:  No CVA tenderness Lungs:  Clear with no wheezes HEART:  Regular rate rhythm, no murmurs, no rubs, no clicks Abd:  soft, positive bowel sounds, no organomegally, no rebound, no guarding Ext:  2 plus pulses, no edema, no cyanosis, no clubbing Skin:  No rashes no nodules Neuro:  CN II through XII intact, motor grossly intact  EKG - sinus bradycardia  DEVICE  Normal device function.  [place] PaceArt for details.   Assess/Plan:   1.  PAF- he appears to be maintaining NSR. He will continue his beta blocker and systemic anti-coagulation. He has not required any flecainide.  2. HTN - his bp is fairly well controlled. He is encouraged to continue exercising 3. Sinus node dysfunction - he is asymptomatic. We discussed the symptoms he might experience if his sinus node dysfunction were to worsen.   Carleene Overlie Kiko Ripp,MD

## 2021-11-04 ENCOUNTER — Other Ambulatory Visit: Payer: Self-pay | Admitting: Internal Medicine

## 2021-11-04 DIAGNOSIS — I4891 Unspecified atrial fibrillation: Secondary | ICD-10-CM

## 2021-11-04 NOTE — Telephone Encounter (Signed)
Xarelto 20mg  refill request received. Pt is 76 years old, weight-98.9kg, Crea-1.21 on 10/22/2021, last seen by Dr. Lovena Le on 10/29/2021, Diagnosis-Afib, CrCl-72.38ml/min; Dose is appropriate based on dosing criteria. Will send in refill to requested pharmacy.

## 2022-05-12 ENCOUNTER — Other Ambulatory Visit: Payer: Self-pay | Admitting: Internal Medicine

## 2022-05-12 DIAGNOSIS — J309 Allergic rhinitis, unspecified: Secondary | ICD-10-CM | POA: Insufficient documentation

## 2022-05-12 DIAGNOSIS — I4891 Unspecified atrial fibrillation: Secondary | ICD-10-CM

## 2022-05-12 DIAGNOSIS — G8929 Other chronic pain: Secondary | ICD-10-CM | POA: Insufficient documentation

## 2022-05-12 NOTE — Telephone Encounter (Signed)
Prescription refill request for Xarelto received.  Indication:Afib Last office visit:1/23 Weight:98.9 kg Age:76 Scr:1.2 CrCl:72.11 ml/min  Prescription refilled

## 2022-10-30 ENCOUNTER — Encounter: Payer: Self-pay | Admitting: Internal Medicine

## 2022-10-30 ENCOUNTER — Ambulatory Visit: Payer: Medicare Other | Attending: Internal Medicine | Admitting: Internal Medicine

## 2022-10-30 VITALS — BP 130/82 | HR 52 | Ht 75.0 in | Wt 220.0 lb

## 2022-10-30 DIAGNOSIS — I480 Paroxysmal atrial fibrillation: Secondary | ICD-10-CM | POA: Diagnosis present

## 2022-10-30 NOTE — Patient Instructions (Signed)
Medication Instructions:  Your physician recommends that you continue on your current medications as directed. Please refer to the Current Medication list given to you today.  *If you need a refill on your cardiac medications before your next appointment, please call your pharmacy*   Lab Work: NONE   If you have labs (blood work) drawn today and your tests are completely normal, you will receive your results only by: MyChart Message (if you have MyChart) OR A paper copy in the mail If you have any lab test that is abnormal or we need to change your treatment, we will call you to review the results.   Testing/Procedures: NONE    Follow-Up: At [facility], you and your health needs are our priority.  As part of our continuing mission to provide you with exceptional heart care, we have created designated Provider Care Teams.  These Care Teams include your primary Cardiologist (physician) and Advanced Practice Providers (APPs -  Physician Assistants and Nurse Practitioners) who all work together to provide you with the care you need, when you need it.  We recommend signing up for the patient portal called "MyChart".  Sign up information is provided on this After Visit Summary.  MyChart is used to connect with patients for Virtual Visits (Telemedicine).  Patients are able to view lab/test results, encounter notes, upcoming appointments, etc.  Non-urgent messages can be sent to your provider as well.   To learn more about what you can do with MyChart, go to [email or web address].    Your next appointment:   1 year(s)  Provider:   Gregg Taylor, MD    Other Instructions Thank you for choosing [facility]!    

## 2022-10-30 NOTE — Progress Notes (Signed)
HPI Ricky Singh returns today for followup of his atrial fib. He is a pleasant 77 yo man with a h/o PAF. He stopped his flecainide over 2 years ago. He has had very rare palpitations. He denies chest pain or sob. No syncope. He is exercising regularly. He has stopped drinking ETOH. 1 caffeine drink a day.  No Known Allergies   Current Outpatient Medications  Medication Sig Dispense Refill   acetaminophen (TYLENOL) 325 MG tablet Take 2 tablets (650 mg total) by mouth every 6 (six) hours as needed for mild pain (or Fever >/= 101).     brimonidine (ALPHAGAN) 0.15 % ophthalmic solution Place 1 drop into the right eye 2 (two) times daily.  11   gabapentin (NEURONTIN) 300 MG capsule Take 300 mg by mouth 3 (three) times daily.      latanoprost (XALATAN) 0.005 % ophthalmic solution Place 1 drop into both eyes daily.     metoprolol tartrate (LOPRESSOR) 50 MG tablet TAKE 1 TABLET TWICE A DAY 180 tablet 2   Multiple Vitamins-Minerals (PRESERVISION AREDS 2) CAPS Take 1 capsule by mouth 2 (two) times daily.      Omega-3 Fatty Acids (FISH OIL) 1200 MG CAPS Take 1 capsule by mouth 2 (two) times daily.      oxybutynin (DITROPAN) 5 MG tablet Take 5 mg by mouth daily.     XARELTO 20 MG TABS tablet TAKE 1 TABLET DAILY WITH   SUPPER 90 tablet 1   No current facility-administered medications for this visit.     Past Medical History:  Diagnosis Date   Allergy    Ankle fracture, left    Arthritis    Cataract    Chronic kidney disease (CKD), stage III (moderate) (HCC)    Femur fracture, right (HCC)    Glaucoma    Hypertension    Macular degeneration    Scoliosis     ROS:   All systems reviewed and negative except as noted in the HPI.   Past Surgical History:  Procedure Laterality Date   COLONOSCOPY     DENTAL SURGERY     [place]   or 30     Family History  Problem Relation Age of Onset   Lymphoma Father 80       started in testicles    Hypertension Mother    Angina Maternal Grandfather    Kidney cancer Cousin    Colon cancer Neg Hx    Esophageal cancer Neg Hx    Rectal cancer Neg Hx    Stomach cancer Neg Hx      Social History   Socioeconomic History   Marital status: Married    Spouse name: Not on file   Number of children: Not on file   Years of education: Not on file   Highest education level: Not on file  Occupational History   Not on file  Tobacco Use   Smoking status: Former   Smokeless tobacco: Never   Tobacco comments:    over 50 years ago   Vaping Use   Vaping Use: Not on file  Substance and Sexual Activity   Alcohol use: Yes    Alcohol/week: 0.0 standard drinks of alcohol    Comment: rare    Drug use: No   Sexual activity: Not on file  Other Topics Concern   Not on file  Social History Narrative   Not on file   Social  Determinants of Health   Financial Resource Strain: Not on file  Food Insecurity: Not on file  Transportation Needs: Not on file  Physical Activity: Not on file  Stress: Not on file  Social Connections: Not on file  Intimate Partner Violence: Not on file     BP 130/82   Pulse (!) 52   Ht '6\' 3"'$  (1.905 m)   Wt 220 lb (99.8 kg)   SpO2 96%   BMI 27.50 kg/m   Physical Exam:  Well appearing NAD HEENT: Unremarkable Neck:  No JVD, no thyromegally Lymphatics:  No adenopathy Back:  No CVA tenderness Lungs:  Clear with no wheezes HEART:  Regular rate rhythm, no murmurs, no rubs, no clicks Abd:  soft, positive bowel sounds, no organomegally, no rebound, no guarding Ext:  2 plus pulses, no edema, no cyanosis, no clubbing Skin:  No rashes no nodules Neuro:  CN II through XII intact, motor grossly intact  EKG - nsr with IRBBB  Assess/Plan:  PAF- he appears to be maintaining NSR. He will continue his beta blocker and systemic anti-coagulation. He has not required any flecainide.  2. HTN - his bp is fairly well controlled. He is encouraged to continue  exercising 3. Sinus node dysfunction - he is asymptomatic. We discussed the symptoms he might experience if his sinus node dysfunction were to worsen.  4. Preop - He is an acceptable surgical candidate for possible ortho surgery.    Ricky Overlie Calix Heinbaugh,MD

## 2022-11-06 ENCOUNTER — Encounter (HOSPITAL_COMMUNITY): Payer: Self-pay | Admitting: *Deleted

## 2022-11-10 ENCOUNTER — Other Ambulatory Visit: Payer: Self-pay

## 2022-11-10 DIAGNOSIS — I4891 Unspecified atrial fibrillation: Secondary | ICD-10-CM

## 2022-11-10 MED ORDER — RIVAROXABAN 20 MG PO TABS: ORAL_TABLET | ORAL | 1 refills | Status: DC

## 2022-11-10 NOTE — Telephone Encounter (Signed)
Prescription refill request for Xarelto received.  Indication: Afib  Last office visit: 10/30/22 Lovena Le)  Weight: 99.8kg Age: 77 Scr: 1.21 (10/22/21)  CrCl: 72.86m/min  Labs overdue. Called PCP to [facility] if labs have been drawn, left message on voicemail.

## 2022-11-10 NOTE — Telephone Encounter (Signed)
Received labs from PCP via fax. Placed in pt's chart under media tab.   Scr 1.1 on 04/28/22.  CrCl: 79.34m/min  Appropriate dose. Refill sent.

## 2023-02-09 ENCOUNTER — Other Ambulatory Visit: Payer: Self-pay | Admitting: Internal Medicine

## 2023-06-15 ENCOUNTER — Other Ambulatory Visit: Payer: Self-pay | Admitting: Internal Medicine

## 2023-06-15 DIAGNOSIS — I4891 Unspecified atrial fibrillation: Secondary | ICD-10-CM

## 2023-06-15 NOTE — Telephone Encounter (Signed)
Prescription refill request for Xarelto received.  Indication:afib Last office visit:2/24 Weight:99.8  kg Age:77 VHQ:ION[facility] labs CrCl:needs labs  Prescription refilled

## 2023-06-29 ENCOUNTER — Telehealth: Payer: Self-pay | Admitting: Internal Medicine

## 2023-06-29 ENCOUNTER — Other Ambulatory Visit (HOSPITAL_COMMUNITY)
Admission: RE | Admit: 2023-06-29 | Discharge: 2023-06-29 | Disposition: A | Discharge status: Home / Self Care | Payer: Medicare Other | Source: Ambulatory Visit | Attending: Internal Medicine | Admitting: Internal Medicine

## 2023-06-29 DIAGNOSIS — I4891 Unspecified atrial fibrillation: Secondary | ICD-10-CM

## 2023-06-29 LAB — CBC WITH DIFFERENTIAL/PLATELET
Abs Immature Granulocytes: 0.02 10*3/uL (ref 0.00–0.07)
Basophils Absolute: 0.1 10*3/uL (ref 0.0–0.1)
Basophils Relative: 1 %
Eosinophils Absolute: 0.3 10*3/uL (ref 0.0–0.5)
Eosinophils Relative: 4 %
HCT: 40.2 % (ref 39.0–52.0)
Hemoglobin: 13.7 g/dL (ref 13.0–17.0)
Immature Granulocytes: 0 %
Lymphocytes Relative: 19 %
Lymphs Abs: 1.3 10*3/uL (ref 0.7–4.0)
MCH: 31.4 pg (ref 26.0–34.0)
MCHC: 34.1 g/dL (ref 30.0–36.0)
MCV: 92.2 fL (ref 80.0–100.0)
Monocytes Absolute: 0.4 10*3/uL (ref 0.1–1.0)
Monocytes Relative: 6 %
Neutro Abs: 4.7 10*3/uL (ref 1.7–7.7)
Neutrophils Relative %: 70 %
Platelets: 187 10*3/uL (ref 150–400)
RBC: 4.36 MIL/uL (ref 4.22–5.81)
RDW: 12.5 % (ref 11.5–15.5)
WBC: 6.7 10*3/uL (ref 4.0–10.5)
nRBC: 0 % (ref 0.0–0.2)

## 2023-06-29 LAB — BASIC METABOLIC PANEL
Anion gap: 8 (ref 5–15)
BUN: 18 mg/dL (ref 8–23)
CO2: 24 mmol/L (ref 22–32)
Calcium: 8.7 mg/dL — ABNORMAL LOW (ref 8.9–10.3)
Chloride: 103 mmol/L (ref 98–111)
Creatinine, Ser: 1.15 mg/dL (ref 0.61–1.24)
GFR, Estimated: >60 mL/min (ref >60)
Glucose, Bld: 95 mg/dL (ref 70–99)
Potassium: 4.1 mmol/L (ref 3.5–5.1)
Sodium: 135 mmol/L (ref 135–145)

## 2023-06-29 MED ORDER — RIVAROXABAN 20 MG PO TABS
20.0000 mg | ORAL_TABLET | Freq: Every day | ORAL | 1 refills | Status: DC
Start: 2023-06-29 — End: 2023-10-12

## 2023-06-29 NOTE — Telephone Encounter (Signed)
Patient is wanting a 90 day supply of Xarelto he was told he could only get 30 day supply because he needed a BMET he has had his blood done today [facility] mail in order

## 2023-06-29 NOTE — Telephone Encounter (Signed)
Prescription refill request for Xarelto received.  Indication: PAF Last office visit: 10/30/22  Rosette Reveal MD Weight: 99.8kg Age: 77 Scr: 1.15 on 06/29/23  Epic CrCl: 74.73   Based on above findings Xarelto 20mg  daily is the appropriate dose.  Refill approved.

## 2023-10-12 ENCOUNTER — Other Ambulatory Visit: Payer: Self-pay | Admitting: *Deleted

## 2023-10-12 DIAGNOSIS — I4891 Unspecified atrial fibrillation: Secondary | ICD-10-CM

## 2023-10-12 MED ORDER — RIVAROXABAN 20 MG PO TABS: 20.0000 mg | ORAL_TABLET | Freq: Every day | ORAL | 1 refills | Status: DC

## 2023-10-12 NOTE — Telephone Encounter (Signed)
Prescription refill request for Xarelto received.  Indication: PAF Last office visit: 10/30/22  Rosette Reveal MD Weight: 99.8kg Age: 78 Scr: 1.15 on 06/29/23  Epic CrCl: 74.73   Based on above findings Xarelto 20mg  daily is the appropriate dose.  Refill approved.

## 2023-10-28 ENCOUNTER — Ambulatory Visit: Payer: Medicare Other | Attending: Internal Medicine | Admitting: Internal Medicine

## 2023-10-28 ENCOUNTER — Encounter: Payer: Self-pay | Admitting: Internal Medicine

## 2023-10-28 VITALS — BP 120/74 | HR 62 | Ht 72.0 in | Wt 229.0 lb

## 2023-10-28 DIAGNOSIS — I4891 Unspecified atrial fibrillation: Secondary | ICD-10-CM | POA: Diagnosis not present

## 2023-10-28 NOTE — Patient Instructions (Signed)

## 2023-10-28 NOTE — Progress Notes (Signed)
HPI [name] returns today for followup of his atrial fib. He is a pleasant 78 yo man with a h/o PAF. He stopped his flecainide over 3 years ago. He has had very rare palpitations. He denies chest pain or sob. No syncope. He is exercising regularly. He has stopped drinking ETOH. 1 caffeine drink a day. Overall he feels well.  No Known Allergies   Current Outpatient Medications  Medication Sig Dispense Refill   acetaminophen (TYLENOL) 325 MG tablet Take 2 tablets (650 mg total) by mouth every 6 (six) hours as needed for mild pain (or Fever >/= 101).     brimonidine (ALPHAGAN) 0.15 % ophthalmic solution Place 1 drop into the right eye 2 (two) times daily.  11   gabapentin (NEURONTIN) 300 MG capsule Take 300 mg by mouth 3 (three) times daily.      latanoprost (XALATAN) 0.005 % ophthalmic solution Place 1 drop into both eyes daily.     metoprolol tartrate (LOPRESSOR) 50 MG tablet TAKE 1 TABLET TWICE A DAY 180 tablet 2   Multiple Vitamins-Minerals (PRESERVISION AREDS 2) CAPS Take 1 capsule by mouth 2 (two) times daily.      Omega-3 Fatty Acids (FISH OIL) 1200 MG CAPS Take 1 capsule by mouth 2 (two) times daily.      oxybutynin (DITROPAN) 5 MG tablet Take 5 mg by mouth daily.     rivaroxaban (XARELTO) 20 MG TABS tablet Take 1 tablet (20 mg total) by mouth daily with supper. 90 tablet 1   No current facility-administered medications for this visit.     Past Medical History:  Diagnosis Date   Allergy    Ankle fracture, left    Arthritis    Cataract    Chronic kidney disease (CKD), stage III (moderate) (HCC)    Femur fracture, right (HCC)    Glaucoma    Hypertension    Macular degeneration    Scoliosis     ROS:   All systems reviewed and negative except as noted in the HPI.   Past Surgical History:  Procedure Laterality Date   COLONOSCOPY     DENTAL SURGERY     FEMUR FRACTURE SURGERY  1950   TONSILLECTOMY  1951   or 31     Family History  Problem Relation Age of  Onset   Lymphoma Father 54       started in testicles   Hypertension Mother    Angina Maternal Grandfather    Kidney cancer Cousin    Colon cancer Neg Hx    Esophageal cancer Neg Hx    Rectal cancer Neg Hx    Stomach cancer Neg Hx      Social History   Socioeconomic History   Marital status: Married    Spouse name: Not on file   Number of children: Not on file   Years of education: Not on file   Highest education level: Not on file  Occupational History   Not on file  Tobacco Use   Smoking status: Former   Smokeless tobacco: Never   Tobacco comments:    over 50 years ago   Vaping Use   Vaping status: Not on file  Substance and Sexual Activity   Alcohol use: Yes    Alcohol/week: 0.0 standard drinks of alcohol    Comment: rare    Drug use: No   Sexual activity: Not on file  Other Topics Concern   Not on file  Social History Narrative  Not on file   Social Drivers of Health   Financial Resource Strain: Not on file  Food Insecurity: Not on file  Transportation Needs: Not on file  Physical Activity: Not on file  Stress: Not on file  Social Connections: Not on file  Intimate Partner Violence: Not on file     BP 120/74   Pulse 62   Ht 6' (1.829 m)   Wt 229 lb (103.9 kg)   SpO2 99%   BMI 31.06 kg/m   Physical Exam:  Well appearing NAD HEENT: Unremarkable Neck:  No JVD, no thyromegally Lymphatics:  No adenopathy Back:  No CVA tenderness Lungs:  Clear with no wheezes HEART:  Regular rate rhythm, no murmurs, no rubs, no clicks Abd:  soft, positive bowel sounds, no organomegally, no rebound, no guarding Ext:  2 plus pulses, no edema, no cyanosis, no clubbing Skin:  No rashes no nodules Neuro:  CN II through XII intact, motor grossly intact  EKG - nsr   Assess/Plan:  PAF- he appears to be maintaining NSR. He will continue his beta blocker and systemic anti-coagulation. He has not required any flecainide.  2. HTN - his bp is fairly well controlled.  He is encouraged to continue exercising       [name]

## 2023-11-09 ENCOUNTER — Telehealth: Payer: Self-pay | Admitting: Internal Medicine

## 2023-11-09 MED ORDER — METOPROLOL TARTRATE 50 MG PO TABS: 50.0000 mg | ORAL_TABLET | Freq: Two times a day (BID) | ORAL | 3 refills | Status: DC

## 2023-11-09 NOTE — Telephone Encounter (Signed)
*  STAT* If patient is at the pharmacy, call can be transferred to refill team.   1. Which medications need to be refilled? (please list name of each medication and dose if known) metoprolol  tartrate (LOPRESSOR ) 50 MG tablet   2. Which pharmacy/location (including street and city if local pharmacy) is medication to be sent to? [facility] - [place], [place] - [address] [facility] AT Portal to Registered [facility] Sites   3. Do they need a 30 day or 90 day supply? 90

## 2023-11-13 ENCOUNTER — Other Ambulatory Visit: Payer: Self-pay | Admitting: Physical Medicine and Rehabilitation

## 2023-11-13 DIAGNOSIS — M5416 Radiculopathy, lumbar region: Secondary | ICD-10-CM

## 2023-11-25 ENCOUNTER — Encounter: Payer: Self-pay | Admitting: Physical Medicine and Rehabilitation

## 2023-12-05 ENCOUNTER — Ambulatory Visit
Admission: RE | Admit: 2023-12-05 | Discharge: 2023-12-05 | Disposition: A | Discharge status: Home / Self Care | Payer: 59 | Source: Ambulatory Visit | Attending: Physical Medicine and Rehabilitation | Admitting: Physical Medicine and Rehabilitation

## 2023-12-05 DIAGNOSIS — M5416 Radiculopathy, lumbar region: Secondary | ICD-10-CM

## 2024-01-04 ENCOUNTER — Other Ambulatory Visit: Payer: Self-pay | Admitting: Internal Medicine

## 2024-01-04 DIAGNOSIS — I4891 Unspecified atrial fibrillation: Secondary | ICD-10-CM

## 2024-01-04 NOTE — Telephone Encounter (Signed)
 Prescription refill request for Xarelto received.  Indication:Afib  Last office visit: 10/28/23 Ricky Singh)  Weight: 103.9kg Age: 78 Scr: 1.15 (06/29/23)  CrCl: 77.80ml/min  Appropriate dose. Refill sent.

## 2024-06-27 ENCOUNTER — Other Ambulatory Visit: Payer: Self-pay | Admitting: Internal Medicine

## 2024-06-27 DIAGNOSIS — I4891 Unspecified atrial fibrillation: Secondary | ICD-10-CM

## 2024-06-27 NOTE — Telephone Encounter (Signed)
 Prescription refill request for Xarelto  received.  Indication: PAF Last office visit: 10/28/23  KANDICE Birmingham MD Weight: 103.9kg Age: 78 Scr: 1.2 on 07/13/23  Epic CrCl: 73.36  Based on above findings Xarelto  20mg  daily is the appropriate dose.  Refill approved.

## 2024-10-07 ENCOUNTER — Encounter: Payer: Self-pay | Admitting: Student in an Organized Health Care Education/Training Program

## 2024-10-07 ENCOUNTER — Ambulatory Visit
Attending: Student in an Organized Health Care Education/Training Program | Admitting: Student in an Organized Health Care Education/Training Program

## 2024-10-07 VITALS — BP 158/84 | HR 61 | Ht 74.0 in | Wt 230.0 lb

## 2024-10-07 DIAGNOSIS — I4891 Unspecified atrial fibrillation: Secondary | ICD-10-CM | POA: Insufficient documentation

## 2024-10-07 DIAGNOSIS — I10 Essential (primary) hypertension: Secondary | ICD-10-CM | POA: Insufficient documentation

## 2024-10-07 MED ORDER — VALSARTAN 80 MG PO TABS: 80.0000 mg | ORAL_TABLET | Freq: Every day | ORAL | 3 refills | Status: AC

## 2024-10-07 NOTE — Progress Notes (Unsigned)
 " Cardiology Office Note   Date: 10/07/24 ID:  Ricky Singh, DOB 1946-06-25, MRN [id number] PCP: Yolande Toribio MATSU, MD  [facility] Providers Cardiologist:  Vina Gull, MD Electrophysiologist:  Donnice DELENA Primus, MD    History of Present Illness Ricky Singh is a 79 y.o. male with paroxysmal AF, SND and HTN who presents for AF management.   Discussed the use of AI scribe software for clinical note transcription with the patient, who gave verbal consent to proceed.  History of Present Illness Ricky Singh is a 79 year old male with atrial fibrillation who presents for cardiovascular follow-up.  He has a history of atrial fibrillation, first identified in November 2019 when he experienced severe chills and a heart rate of 170 bpm. He was hospitalized for four days and diagnosed with scarlet fever at that time. Since then, he has not experienced significant episodes of high heart rate, except for one possible instance of being out of breath during a walk in the summer of 2019, which did not recur.  He has a long-standing history of hypertension, managed with blood pressure medication for 35 years. He was previously on doxazosin and another unspecified medication, but was switched to metoprolol , which he takes twice daily. He recalls a recent episode of lung congestion before Christmas.  He mentions a decrease in physical activity, noting that he used to walk a mile and a half regularly but has not done so for about a month, attributing this to a lack of motivation since retirement. He also performs exercises for a bad back.  His wife passed away three years ago after a series of health issues, including breast cancer, polycythemia, pancreatitis, and lymphoma. He lives alone but has a daughter who lives nearby.  ROS: none  Studies Reviewed  ECG review 10/07/24: NSR 61, PR 194, QRS 86, QT/c 406/408 10/28/23: NSR 61, PR 180, QRS 92, QT/c 390/392 03/06/19: NSR 68, PR 192 (manual),  QRS 109, QT/c 422/449 09/01/18: SB 52, PR 200, QRS 100, QT/c 432/401, 1' AVB 08/18/18: AF/RVR 120, QRS 88, QT/c 306/432 08/17/18: ST 106, QRS 85, QT/c 295/392  TTE Result date: 08/18/18 - Left ventricle: The cavity size was normal. Systolic function was    normal. The estimated ejection fraction was in the range of 60%    to 65%. Wall motion was normal; there were no regional wall    motion abnormalities. Left ventricular diastolic function    parameters were normal.  - Aortic valve: Valve area (VTI): 2.72 cm^2. Valve area (Vmean):    2.72 cm^2.  - Left atrium: The atrium was mildly dilated.  - Atrial septum: No defect or patent foramen ovale was identified.   Risk Assessment/Calculations  CHA2DS2-VASc Score = 2  This indicates a 2.2% annual risk of stroke. The patient's score is based upon: CHF History: 0 HTN History: 0 Diabetes History: 0 Stroke History: 0 Vascular Disease History: 0 Age Score: 2 Gender Score: 0  Physical Exam VS:  BP (!) 158/84 (BP Location: Right Arm, Cuff Size: Normal)   Pulse 61   Ht 6' 2 (1.88 m)   Wt 230 lb (104.3 kg)   SpO2 98%   BMI 29.53 kg/m   Wt Readings from Last 3 Encounters:  10/07/24 230 lb (104.3 kg)  10/28/23 229 lb (103.9 kg)  10/30/22 220 lb (99.8 kg)    GEN: Well nourished, well developed in no acute distress CARDIAC: RRR, no murmurs, rubs, gallops RESPIRATORY:  Clear to auscultation  without rales, wheezing or rhonchi  EXTREMITIES:  No edema; No deformity   ASSESSMENT AND PLAN Ricky Singh is a 79 y.o. male with paroxysmal AF, SND and HTN who presents for AF management.  Assessment & Plan Atrial fibrillation Well-controlled, no recent episodes. Discontinued lopressor  50 mg bid today since he has had no AF recurrence in several years. Started valsartan  as below for better HTN control.  - Discontinued metoprolol . - Monitor for recurrence of AFib symptoms and report if episodes occur.  Primary hypertension Blood pressure  elevated today, atypical. Previously controlled on metoprolol . Valsartan  chosen for efficacy and coverage. - Discontinued metoprolol . - Initiated valsartan  80 mg once daily. - Monitor blood pressure at home, report if consistently in the 160s. - Contact provider if blood pressure remains elevated after one to two weeks.  Dispo: RTC 1 year  Signed, Donnice DELENA Primus, MD  "

## 2024-10-07 NOTE — Patient Instructions (Signed)
 Medication Instructions:  Your physician has recommended you make the following change in your medication:   ** Stop Metoprolol   ** Begin Valsartan  80mg  - 1 tablet by mouth daily.  *If you need a refill on your cardiac medications before your next appointment, please call your pharmacy*  Lab Work: None ordered.  If you have labs (blood work) drawn today and your tests are completely normal, you will receive your results only by: MyChart Message (if you have MyChart) OR A paper copy in the mail If you have any lab test that is abnormal or we need to change your treatment, we will call you to review the results.  Testing/Procedures: None ordered.   Follow-Up: At [facility], you and your health needs are our priority.  As part of our continuing mission to provide you with exceptional heart care, our providers are all part of one team.  This team includes your primary Cardiologist (physician) and Advanced Practice Providers or APPs (Physician Assistants and Nurse Practitioners) who all work together to provide you with the care you need, when you need it.  Your next appointment:   12 months with Dr Almetta
